# Patient Record
Sex: Female | Born: 1950 | Race: Black or African American | Hispanic: No | Marital: Single | Smoking: Never smoker
Health system: Southern US, Community
[De-identification: ages and names within clinical notes are randomized; demographics above are authoritative.]

## PROBLEM LIST (undated history)

## (undated) DIAGNOSIS — E119 Type 2 diabetes mellitus without complications: Secondary | ICD-10-CM

## (undated) DIAGNOSIS — I872 Venous insufficiency (chronic) (peripheral): Secondary | ICD-10-CM

## (undated) DIAGNOSIS — I10 Essential (primary) hypertension: Secondary | ICD-10-CM

## (undated) DIAGNOSIS — F32A Depression, unspecified: Secondary | ICD-10-CM

## (undated) DIAGNOSIS — F4310 Post-traumatic stress disorder, unspecified: Secondary | ICD-10-CM

## (undated) DIAGNOSIS — E8881 Metabolic syndrome: Secondary | ICD-10-CM

## (undated) DIAGNOSIS — E039 Hypothyroidism, unspecified: Secondary | ICD-10-CM

## (undated) DIAGNOSIS — I480 Paroxysmal atrial fibrillation: Secondary | ICD-10-CM

## (undated) DIAGNOSIS — I69911 Memory deficit following unspecified cerebrovascular disease: Secondary | ICD-10-CM

## (undated) DIAGNOSIS — E0500 Thyrotoxicosis with diffuse goiter without thyrotoxic crisis or storm: Secondary | ICD-10-CM

## (undated) DIAGNOSIS — A048 Other specified bacterial intestinal infections: Secondary | ICD-10-CM

## (undated) DIAGNOSIS — E785 Hyperlipidemia, unspecified: Secondary | ICD-10-CM

## (undated) DIAGNOSIS — E079 Disorder of thyroid, unspecified: Secondary | ICD-10-CM

## (undated) HISTORY — DX: Metabolic syndrome: E88.81

## (undated) HISTORY — DX: Other specified bacterial intestinal infections: A04.8

## (undated) HISTORY — DX: Metabolic syndrome: E88.810

## (undated) HISTORY — DX: Depression, unspecified: F32.A

## (undated) HISTORY — DX: Paroxysmal atrial fibrillation: I48.0

## (undated) HISTORY — PX: ESOPHAGOGASTRODUODENOSCOPY: SHX1529

## (undated) HISTORY — DX: Hypothyroidism, unspecified: E03.9

## (undated) HISTORY — DX: Venous insufficiency (chronic) (peripheral): I87.2

## (undated) HISTORY — DX: Post-traumatic stress disorder, unspecified: F43.10

## (undated) HISTORY — PX: THYROIDECTOMY: SHX17

## (undated) HISTORY — PX: COLONOSCOPY: SHX174

## (undated) HISTORY — PX: VAGINAL HYSTERECTOMY: SUR661

## (undated) HISTORY — DX: Memory deficit following unspecified cerebrovascular disease: I69.911

## (undated) HISTORY — DX: Hyperlipidemia, unspecified: E78.5

---

## 2014-10-23 ENCOUNTER — Emergency Department (HOSPITAL_COMMUNITY)
Admission: EM | Admit: 2014-10-23 | Discharge: 2014-10-23 | Disposition: A | Discharge status: Home / Self Care | Payer: Non-veteran care | Attending: Emergency Medicine | Admitting: Emergency Medicine

## 2014-10-23 ENCOUNTER — Encounter (HOSPITAL_COMMUNITY): Payer: Self-pay | Admitting: *Deleted

## 2014-10-23 ENCOUNTER — Emergency Department (HOSPITAL_COMMUNITY): Payer: Non-veteran care

## 2014-10-23 DIAGNOSIS — I498 Other specified cardiac arrhythmias: Secondary | ICD-10-CM

## 2014-10-23 DIAGNOSIS — R110 Nausea: Secondary | ICD-10-CM | POA: Insufficient documentation

## 2014-10-23 DIAGNOSIS — R42 Dizziness and giddiness: Secondary | ICD-10-CM | POA: Insufficient documentation

## 2014-10-23 DIAGNOSIS — R002 Palpitations: Secondary | ICD-10-CM

## 2014-10-23 DIAGNOSIS — R008 Other abnormalities of heart beat: Secondary | ICD-10-CM | POA: Insufficient documentation

## 2014-10-23 DIAGNOSIS — I499 Cardiac arrhythmia, unspecified: Secondary | ICD-10-CM

## 2014-10-23 DIAGNOSIS — E119 Type 2 diabetes mellitus without complications: Secondary | ICD-10-CM | POA: Insufficient documentation

## 2014-10-23 DIAGNOSIS — M79606 Pain in leg, unspecified: Secondary | ICD-10-CM | POA: Insufficient documentation

## 2014-10-23 DIAGNOSIS — I10 Essential (primary) hypertension: Secondary | ICD-10-CM | POA: Insufficient documentation

## 2014-10-23 HISTORY — DX: Essential (primary) hypertension: I10

## 2014-10-23 HISTORY — DX: Type 2 diabetes mellitus without complications: E11.9

## 2014-10-23 LAB — CBC WITH DIFFERENTIAL/PLATELET
Basophils Absolute: 0 10*3/uL (ref 0.0–0.1)
Basophils Relative: 0 % (ref 0–1)
Eosinophils Absolute: 0 10*3/uL (ref 0.0–0.7)
Eosinophils Relative: 0 % (ref 0–5)
HEMATOCRIT: 35.4 % — AB (ref 36.0–46.0)
HEMOGLOBIN: 11.7 g/dL — AB (ref 12.0–15.0)
LYMPHS ABS: 1.1 10*3/uL (ref 0.7–4.0)
LYMPHS PCT: 28 % (ref 12–46)
MCH: 27 pg (ref 26.0–34.0)
MCHC: 33.1 g/dL (ref 30.0–36.0)
MCV: 81.8 fL (ref 78.0–100.0)
Monocytes Absolute: 0.5 10*3/uL (ref 0.1–1.0)
Monocytes Relative: 12 % (ref 3–12)
NEUTROS ABS: 2.3 10*3/uL (ref 1.7–7.7)
Neutrophils Relative %: 60 % (ref 43–77)
Platelets: 199 10*3/uL (ref 150–400)
RBC: 4.33 MIL/uL (ref 3.87–5.11)
RDW: 14 % (ref 11.5–15.5)
WBC: 3.9 10*3/uL — AB (ref 4.0–10.5)

## 2014-10-23 LAB — I-STAT CHEM 8, ED
BUN: 18 mg/dL (ref 6–23)
CALCIUM ION: 1.22 mmol/L (ref 1.13–1.30)
Chloride: 102 mmol/L (ref 96–112)
Creatinine, Ser: 0.8 mg/dL (ref 0.50–1.10)
GLUCOSE: 176 mg/dL — AB (ref 70–99)
HEMATOCRIT: 37 % (ref 36.0–46.0)
Hemoglobin: 12.6 g/dL (ref 12.0–15.0)
Potassium: 4 mmol/L (ref 3.5–5.1)
SODIUM: 140 mmol/L (ref 135–145)
TCO2: 21 mmol/L (ref 0–100)

## 2014-10-23 LAB — CBG MONITORING, ED: GLUCOSE-CAPILLARY: 168 mg/dL — AB (ref 70–99)

## 2014-10-23 LAB — TROPONIN I: Troponin I: <0.03 ng/mL (ref <0.031)

## 2014-10-23 NOTE — ED Notes (Signed)
Pt undressed, in gown, on monitor, continuous pulse oximetry and blood pressure cuff; vitals and EKG performed 

## 2014-10-23 NOTE — ED Provider Notes (Signed)
CSN: [id number]     Arrival date & time 10/23/14  0610 History   None    Chief Complaint  Patient presents with  . Palpitations     (Consider location/radiation/quality/duration/timing/severity/associated sxs/prior Treatment) HPI Comments: Pt states that she was seen by cardiology several years ago for similar episodes and nothing was started  Patient is a 63 y.o. female presenting with palpitations. The history is provided by the patient. No language interpreter was used.  Palpitations Palpitations quality:  Fast Onset quality:  Sudden Timing:  Constant Progression:  Unchanged Chronicity:  Recurrent Relieved by:  Nothing Worsened by:  Nothing Ineffective treatments:  None tried Associated symptoms: dizziness, leg pain and nausea   Associated symptoms: no back pain, no cough, no diaphoresis, no lower extremity edema, no shortness of breath and no vomiting   Risk factors: diabetes mellitus     Past Medical History  Diagnosis Date  . Hypertension   . Diabetes mellitus without complication     Type 2   Past Surgical History  Procedure Laterality Date  . Vaginal hysterectomy     No family history on file. History  Substance Use Topics  . Smoking status: Never Smoker   . Smokeless tobacco: Never Used  . Alcohol Use: No   OB History    No data available     Review of Systems  Constitutional: Negative for diaphoresis.  Respiratory: Negative for cough and shortness of breath.   Cardiovascular: Positive for palpitations.  Gastrointestinal: Positive for nausea. Negative for vomiting.  Musculoskeletal: Negative for back pain.  Neurological: Positive for dizziness.  All other systems reviewed and are negative.     Allergies  Review of patient's allergies indicates not on file.  Home Medications   Prior to Admission medications   Not on File   BP 185/76 mmHg  Pulse 93  Temp(Src) 98.4 F (36.9 C) (Oral)  Resp 18  Ht  (1.676 m)  Wt 162 lb (73.483 kg)   BMI 26.16 kg/m2  SpO2 100% Physical Exam  Constitutional: She is oriented to person, place, and time. She appears well-developed and well-nourished.  Cardiovascular: Normal rate and regular rhythm.   Pulmonary/Chest: Effort normal and breath sounds normal.  Abdominal: Soft. Bowel sounds are normal. She exhibits no distension.  Musculoskeletal: Normal range of motion.  Neurological: She is alert and oriented to person, place, and time.  Skin: Skin is warm and dry.  Psychiatric: She has a normal mood and affect.  Nursing note and vitals reviewed.   ED Course  Procedures (including critical care time) Labs Review Labs Reviewed  CBC WITH DIFFERENTIAL/PLATELET - Abnormal; Notable for the following:    WBC 3.9 (*)    Hemoglobin 11.7 (*)    HCT 35.4 (*)    All other components within normal limits  I-STAT CHEM 8, ED - Abnormal; Notable for the following:    Glucose, Bld 176 (*)    All other components within normal limits  CBG MONITORING, ED - Abnormal; Notable for the following:    Glucose-Capillary 168 (*)    All other components within normal limits  TROPONIN I    Imaging Review Dg Chest 2 View  10/23/2014   CLINICAL DATA:  Chest heaviness and weakness for 1 day. Initial encounter.  EXAM: CHEST  2 VIEW  COMPARISON:  None.  FINDINGS: Cardiopericardial silhouette within normal limits. Mediastinal contours normal. Trachea midline. No airspace disease or effusion. Increased density is present over the mid thoracic spine which is  most compatible with a marginal osteophyte.  IMPRESSION: No active cardiopulmonary disease.   Electronically Signed   By: Andreas NewportGeoffrey  Lamke M.D.   On: 10/23/2014 07:05     EKG Interpretation   Date/Time:  Friday October 23 2014 06:19:01 EST Ventricular Rate:  95 PR Interval:  153 QRS Duration: 78 QT Interval:  407 QTC Calculation: 512 R Axis:   38 Text Interpretation:  Ventricular bigeminy Probable left atrial  enlargement Prolonged QT interval No previous  ECGs available Reconfirmed  by DOCHERTY  MD, MEGAN ([id number]) on 10/23/2014 7:13:24 AM      MDM   Final diagnoses:  Palpitations  Bigeminal rhythm    Discussed findings with pt and pt told that she may need medication changes and she could discuss that with pcp or with cardiology. Pt doesn't have cardiology set up here. Pt states that she goes to doctor in [place]    Ta Fair, NP 10/23/14 [id number]  Richardean Canalavid H Yao, MD 10/23/14 2255

## 2014-10-23 NOTE — ED Notes (Signed)
Pt c/o palpitations and could feel her heart beating in her neck. Pt also c/o lower bilateral le pain that began yesterday. Pt also states concern of her blood sugar.

## 2014-10-23 NOTE — Discharge Instructions (Signed)
Follow up with your doctor or cardiology to discuss medication changes Premature Ventricular Contraction Premature ventricular contraction (PVC) is an irregularity of the heart rhythm involving extra or skipped heartbeats. In some cases, they may occur without obvious cause or heart disease. Other times, they can be caused by an electrolyte change in the blood. These need to be corrected. They can also be seen when there is not enough oxygen going to the heart. A common cause of this is plaque or cholesterol buildup. This buildup decreases the blood supply to the heart. In addition, extra beats may be caused or aggravated by:  Excessive smoking.  Alcohol consumption.  Caffeine.  Certain medications  Some street drugs. SYMPTOMS   The sensation of feeling your heart skipping a beat (palpitations).  In many cases, the person may have no symptoms. SIGNS AND TESTS   A physical examination may show an occasional irregularity, but if the PVC beats do not happen often, they may not be found on physical exam.  Blood pressure is usually normal.  Other tests that may find extra beats of the heart are:  An EKG (electrocardiogram)  A Holter monitor which can monitor your heart over longer periods of time  An Angiogram (study of the heart arteries). TREATMENT  Usually extra heartbeats do not need treatment. The condition is treated only if symptoms are severe or if extra beats are very frequent or are causing problems. An underlying cause, if discovered, may also require treatment.  Treatment may also be needed if there may be a risk for other more serious cardiac arrhythmias.  PREVENTION   Moderation in caffeine, alcohol, and tobacco use may reduce the risk of ectopic heartbeats in some people.  Exercise often helps people who lead a sedentary (inactive) lifestyle. PROGNOSIS  PVC heartbeats are generally harmless and do not need treatment.  RISKS AND COMPLICATIONS   Ventricular  tachycardia (occasionally).  There usually are no complications.  Other arrhythmias (occasionally). SEEK IMMEDIATE MEDICAL CARE IF:   You feel palpitations that are frequent or continual.  You develop chest pain or other problems such as shortness of breath, sweating, or nausea and vomiting.  You become light-headed or faint (pass out).  You get worse or do not improve with treatment. Document Released: 03/17/2004 Document Revised: 10/23/2011 Document Reviewed: 09/27/2007 [facility] Patient Information 2015 [employer] [place]. This information is not intended to replace advice given to you by your health care provider. Make sure you discuss any questions you have with your health care provider.

## 2016-06-08 ENCOUNTER — Encounter (HOSPITAL_COMMUNITY): Payer: Self-pay | Admitting: Emergency Medicine

## 2016-06-08 ENCOUNTER — Emergency Department (HOSPITAL_COMMUNITY): Payer: Non-veteran care

## 2016-06-08 ENCOUNTER — Emergency Department (HOSPITAL_COMMUNITY)
Admission: EM | Admit: 2016-06-08 | Discharge: 2016-06-08 | Disposition: A | Discharge status: Home / Self Care | Payer: Non-veteran care | Attending: Emergency Medicine | Admitting: Emergency Medicine

## 2016-06-08 DIAGNOSIS — Z79899 Other long term (current) drug therapy: Secondary | ICD-10-CM | POA: Insufficient documentation

## 2016-06-08 DIAGNOSIS — Y929 Unspecified place or not applicable: Secondary | ICD-10-CM | POA: Diagnosis not present

## 2016-06-08 DIAGNOSIS — Z7982 Long term (current) use of aspirin: Secondary | ICD-10-CM | POA: Diagnosis not present

## 2016-06-08 DIAGNOSIS — W109XXA Fall (on) (from) unspecified stairs and steps, initial encounter: Secondary | ICD-10-CM | POA: Diagnosis not present

## 2016-06-08 DIAGNOSIS — Y9301 Activity, walking, marching and hiking: Secondary | ICD-10-CM | POA: Insufficient documentation

## 2016-06-08 DIAGNOSIS — R6883 Chills (without fever): Secondary | ICD-10-CM | POA: Diagnosis not present

## 2016-06-08 DIAGNOSIS — Z7984 Long term (current) use of oral hypoglycemic drugs: Secondary | ICD-10-CM | POA: Diagnosis not present

## 2016-06-08 DIAGNOSIS — Y999 Unspecified external cause status: Secondary | ICD-10-CM | POA: Diagnosis not present

## 2016-06-08 DIAGNOSIS — E119 Type 2 diabetes mellitus without complications: Secondary | ICD-10-CM | POA: Diagnosis not present

## 2016-06-08 DIAGNOSIS — R5383 Other fatigue: Secondary | ICD-10-CM | POA: Insufficient documentation

## 2016-06-08 DIAGNOSIS — I10 Essential (primary) hypertension: Secondary | ICD-10-CM | POA: Diagnosis not present

## 2016-06-08 DIAGNOSIS — R531 Weakness: Secondary | ICD-10-CM | POA: Insufficient documentation

## 2016-06-08 DIAGNOSIS — J189 Pneumonia, unspecified organism: Secondary | ICD-10-CM | POA: Insufficient documentation

## 2016-06-08 DIAGNOSIS — J181 Lobar pneumonia, unspecified organism: Secondary | ICD-10-CM

## 2016-06-08 DIAGNOSIS — R51 Headache: Secondary | ICD-10-CM | POA: Insufficient documentation

## 2016-06-08 DIAGNOSIS — R002 Palpitations: Secondary | ICD-10-CM | POA: Diagnosis present

## 2016-06-08 HISTORY — DX: Thyrotoxicosis with diffuse goiter without thyrotoxic crisis or storm: E05.00

## 2016-06-08 HISTORY — DX: Disorder of thyroid, unspecified: E07.9

## 2016-06-08 LAB — CBC WITH DIFFERENTIAL/PLATELET
Basophils Absolute: 0 10*3/uL (ref 0.0–0.1)
Basophils Relative: 0 %
Eosinophils Absolute: 0 10*3/uL (ref 0.0–0.7)
Eosinophils Relative: 0 %
HEMATOCRIT: 43.3 % (ref 36.0–46.0)
HEMOGLOBIN: 14.1 g/dL (ref 12.0–15.0)
LYMPHS PCT: 16 %
Lymphs Abs: 1.3 10*3/uL (ref 0.7–4.0)
MCH: 26.9 pg (ref 26.0–34.0)
MCHC: 32.6 g/dL (ref 30.0–36.0)
MCV: 82.5 fL (ref 78.0–100.0)
Monocytes Absolute: 0.3 10*3/uL (ref 0.1–1.0)
Monocytes Relative: 4 %
Neutro Abs: 6.5 10*3/uL (ref 1.7–7.7)
Neutrophils Relative %: 80 %
Platelets: 292 10*3/uL (ref 150–400)
RBC: 5.25 MIL/uL — ABNORMAL HIGH (ref 3.87–5.11)
RDW: 15.2 % (ref 11.5–15.5)
WBC: 8.2 10*3/uL (ref 4.0–10.5)

## 2016-06-08 LAB — URINALYSIS, ROUTINE W REFLEX MICROSCOPIC
Bilirubin Urine: NEGATIVE
GLUCOSE, UA: NEGATIVE mg/dL
Hgb urine dipstick: NEGATIVE
Ketones, ur: NEGATIVE mg/dL
Nitrite: NEGATIVE
PH: 6.5 (ref 5.0–8.0)
Protein, ur: NEGATIVE mg/dL
Specific Gravity, Urine: 1.003 — ABNORMAL LOW (ref 1.005–1.030)

## 2016-06-08 LAB — COMPREHENSIVE METABOLIC PANEL
ALT: 22 U/L (ref 14–54)
AST: 27 U/L (ref 15–41)
Albumin: 4.3 g/dL (ref 3.5–5.0)
Alkaline Phosphatase: 85 U/L (ref 38–126)
Anion gap: 8 (ref 5–15)
BUN: 11 mg/dL (ref 6–20)
CO2: 26 mmol/L (ref 22–32)
CREATININE: 0.97 mg/dL (ref 0.44–1.00)
Calcium: 9.7 mg/dL (ref 8.9–10.3)
Chloride: 103 mmol/L (ref 101–111)
GFR calc Af Amer: >60 mL/min (ref >60)
Glucose, Bld: 182 mg/dL — ABNORMAL HIGH (ref 65–99)
Potassium: 4.2 mmol/L (ref 3.5–5.1)
Sodium: 137 mmol/L (ref 135–145)
Total Bilirubin: 0.7 mg/dL (ref 0.3–1.2)
Total Protein: 8.1 g/dL (ref 6.5–8.1)

## 2016-06-08 LAB — URINE MICROSCOPIC-ADD ON
Bacteria, UA: NONE SEEN
RBC / HPF: NONE SEEN RBC/hpf (ref 0–5)

## 2016-06-08 LAB — I-STAT TROPONIN, ED: TROPONIN I, POC: 0 ng/mL (ref 0.00–0.08)

## 2016-06-08 LAB — T4, FREE: Free T4: 0.92 ng/dL (ref 0.61–1.12)

## 2016-06-08 LAB — TSH: TSH: 9.561 u[IU]/mL — ABNORMAL HIGH (ref 0.350–4.500)

## 2016-06-08 MED ORDER — LEVOFLOXACIN 500 MG PO TABS: 500.0000 mg | ORAL_TABLET | Freq: Every day | ORAL | 0 refills | Status: AC

## 2016-06-08 MED ORDER — ONDANSETRON 4 MG PO TBDP: 4.0000 mg | ORAL_TABLET | Freq: Three times a day (TID) | ORAL | 0 refills | Status: DC | PRN

## 2016-06-08 MED ORDER — SODIUM CHLORIDE 0.9 % IV BOLUS (SEPSIS)
1000.0000 mL | Freq: Once | INTRAVENOUS | Status: AC
  Administered 2016-06-08: 1000 mL via INTRAVENOUS

## 2016-06-08 MED ORDER — LISINOPRIL 20 MG PO TABS
20.0000 mg | ORAL_TABLET | Freq: Once | ORAL | Status: AC
  Administered 2016-06-08: 20 mg via ORAL
  Filled 2016-06-08: qty 1

## 2016-06-08 MED ORDER — LISINOPRIL 10 MG PO TABS: 30.0000 mg | ORAL_TABLET | Freq: Every day | ORAL | 0 refills | Status: DC

## 2016-06-08 MED ORDER — HYDRALAZINE HCL 25 MG PO TABS
50.0000 mg | ORAL_TABLET | Freq: Once | ORAL | Status: AC
  Administered 2016-06-08: 50 mg via ORAL
  Filled 2016-06-08: qty 2

## 2016-06-08 MED ORDER — IOPAMIDOL (ISOVUE-300) INJECTION 61%
INTRAVENOUS | Status: AC
  Administered 2016-06-08: 100 mL
  Filled 2016-06-08: qty 100

## 2016-06-08 NOTE — ED Notes (Signed)
Patient has returned from being out of the department; placed back on monitor, continuous pulse oximetry and blood pressure cuff 

## 2016-06-08 NOTE — ED Notes (Signed)
Pt called out c/o palpations and back pain. Pt BP 199/80. Pt states "I feel bad." EKG performed. Dr. Dalene SeltzerSchlossman notified and given EKG.

## 2016-06-08 NOTE — ED Triage Notes (Signed)
Pt. woke up with palpitations , fatigue/generalized weakness , lost her balance and fell at home this morning , denies LOC , alert and oriented , respirations unlabored. No fever or chills.

## 2016-06-08 NOTE — ED Provider Notes (Signed)
[facility]-EMERGENCY DEPT Provider Note   CSN: [id number] Arrival date & time: 06/08/16  0603     History   Chief Complaint Chief Complaint  Patient presents with  . Palpitations  . Fall  . Fatigue    HPI Stacy Nash is a 65 y.o. female.  HPI   Lost balance when walking down to bottom step. Had to stay there.   Fell down 2 steps, no head trauma, no LOC, went to right side.  Able to walk ok after falling.  No energy. Has fallen before, in the past passed out, [facility]   Feeling cold, like can't get warm. Had total thyroidectomy last year, had Grave's disease, DM, htn  Feeling badly for day or two, fatigue generally weak, no fevers. Dry mouth, chills.  Nausea, no vomiting.    Checked BP at home and it read 212/118, read irregular heart beat.   Past Medical History:  Diagnosis Date  . Diabetes mellitus without complication (HCC)    Type 2  . Graves disease   . Hypertension   . Thyroid disease     There are no active problems to display for this patient.   Past Surgical History:  Procedure Laterality Date  . VAGINAL HYSTERECTOMY      OB History    No data available       Home Medications    Prior to Admission medications   Medication Sig Start Date End Date Taking? Authorizing Provider  AMLODIPINE BESYLATE PO Take 1 tablet by mouth daily.   Yes Historical Provider, MD  aspirin EC 81 MG tablet Take 81 mg by mouth daily.   Yes Historical Provider, MD  CARVEDILOL PO Take 1 tablet by mouth 2 (two) times daily.   Yes Historical Provider, MD  GABAPENTIN PO Take 1 capsule by mouth 3 (three) times daily.   Yes Historical Provider, MD  levothyroxine (SYNTHROID, LEVOTHROID) 88 MCG tablet Take 88 mcg by mouth daily before breakfast.   Yes Historical Provider, MD  MAGNESIUM PO Take 1 tablet by mouth daily.   Yes Historical Provider, MD  METFORMIN HCL PO Take 1 tablet by mouth daily.   Yes Historical Provider, MD  potassium chloride SA  (K-DUR,KLOR-CON) 20 MEQ tablet Take 20 mEq by mouth 2 (two) times daily.   Yes Historical Provider, MD  PRAVASTATIN SODIUM PO Take 1 tablet by mouth daily.   Yes Historical Provider, MD  TRAZODONE HCL PO Take 1 tablet by mouth at bedtime.   Yes Historical Provider, MD  levofloxacin (LEVAQUIN) 500 MG tablet Take 1 tablet (500 mg total) by mouth daily. 06/08/16 06/15/16  Alvira MondayErin Lynard Postlewait, MD  lisinopril (PRINIVIL,ZESTRIL) 10 MG tablet Take 3 tablets (30 mg total) by mouth daily. 06/08/16 07/08/16  Alvira MondayErin Jaquavian Firkus, MD  ondansetron (ZOFRAN ODT) 4 MG disintegrating tablet Take 1 tablet (4 mg total) by mouth every 8 (eight) hours as needed for nausea or vomiting. 06/08/16   Alvira MondayErin Yalanda Soderman, MD    Family History No family history on file.  Social History Social History  Substance Use Topics  . Smoking status: Never Smoker  . Smokeless tobacco: Never Used  . Alcohol use No     Allergies   Sudafed [pseudoephedrine hcl]   Review of Systems Review of Systems  Constitutional: Positive for fatigue. Negative for fever.  HENT: Negative for congestion and sore throat.   Eyes: Negative for visual disturbance.  Respiratory: Negative for cough and shortness of breath.   Cardiovascular: Negative  for chest pain.  Gastrointestinal: Positive for diarrhea (x2) and nausea. Negative for abdominal pain, blood in stool and vomiting.  Genitourinary: Negative for difficulty urinating and dysuria.  Musculoskeletal: Positive for myalgias (since fall legs hurt). Negative for back pain and neck pain.  Skin: Negative for rash.  Neurological: Negative for syncope, facial asymmetry, speech difficulty, weakness, numbness and headaches.     Physical Exam Updated Vital Signs BP 165/89   Pulse 76   Temp 98.1 F (36.7 C)   Resp 25   Ht 5\' 5"  (1.651 m)   Wt 170 lb (77.1 kg)   SpO2 98%   BMI 28.29 kg/m   Physical Exam  Constitutional: She is oriented to person, place, and time. She appears well-developed and  well-nourished. No distress.  Appears fatigued, tearful at times  HENT:  Head: Normocephalic and atraumatic.  Mouth/Throat: Oropharynx is clear and moist. No oropharyngeal exudate.  Eyes: Conjunctivae and EOM are normal. Pupils are equal, round, and reactive to light.  Neck: Normal range of motion.  Cardiovascular: Normal rate, regular rhythm, normal heart sounds and intact distal pulses.  Exam reveals no gallop and no friction rub.   No murmur heard. Pulmonary/Chest: Effort normal and breath sounds normal. No respiratory distress. She has no wheezes. She has no rales.  Abdominal: Soft. She exhibits no distension. There is tenderness (on reevaluation reports LLQ tenderness). There is CVA tenderness (L). There is no guarding.  Musculoskeletal: She exhibits no edema or tenderness.  Neurological: She is alert and oriented to person, place, and time. She has normal strength. No cranial nerve deficit or sensory deficit. Coordination and gait normal. GCS eye subscore is 4. GCS verbal subscore is 5. GCS motor subscore is 6.  Skin: Skin is warm and dry. No rash noted. She is not diaphoretic. No erythema.  Nursing note and vitals reviewed.    ED Treatments / Results  Labs (all labs ordered are listed, but only abnormal results are displayed) Labs Reviewed  CBC WITH DIFFERENTIAL/PLATELET - Abnormal; Notable for the following:       Result Value   RBC 5.25 (*)    All other components within normal limits  COMPREHENSIVE METABOLIC PANEL - Abnormal; Notable for the following:    Glucose, Bld 182 (*)    All other components within normal limits  URINALYSIS, ROUTINE W REFLEX MICROSCOPIC (NOT AT [facility]) - Abnormal; Notable for the following:    Specific Gravity, Urine 1.003 (*)    Leukocytes, UA MODERATE (*)    All other components within normal limits  TSH - Abnormal; Notable for the following:    TSH 9.561 (*)    All other components within normal limits  URINE MICROSCOPIC-ADD ON - Abnormal;  Notable for the following:    Squamous Epithelial / LPF 0-5 (*)    All other components within normal limits  T4, FREE  I-STAT TROPOININ, ED    EKG  EKG Interpretation  Date/Time:  Thursday June 08 2016 06:11:54 EDT Ventricular Rate:  70 PR Interval:  170 QRS Duration: 78 QT Interval:  408 QTC Calculation: 440 R Axis:   71 Text Interpretation:  Normal sinus rhythm Normal ECG No significant change since last tracing Confirmed by [facility] MD, Helga Asbury ([id number]) on 06/08/2016 7:02:43 AM Also confirmed by [facility] MD, Kennadie Brenner ([id number]), editor [place] CT, Jola Babinski [phone])  on 06/08/2016 8:42:10 AM       Radiology Dg Chest 2 View  Result Date: 06/08/2016 CLINICAL DATA:  65 year old female with fall and palpitation.  EXAM: CHEST  2 VIEW COMPARISON:  Chest radiograph dated 10/23/2014 FINDINGS: The lungs are clear. There is no pleural effusion or pneumothorax. The cardiac silhouette is within normal limits. There is a focal area of minimal cortical irregularity and apparent angulation involving the right humeral neck. Although this may be artifactual a nondisplaced fracture is not excluded. Correlation with clinical exam and symptoms and further evaluation with dedicated right shoulder radiographs, if clinically indicated, recommended. IMPRESSION: No acute cardiopulmonary process. Artifact versus less likely nondisplaced fracture of the right humeral neck. Clinical correlation is recommended. Electronically Signed   By: Elgie Collard M.D.   On: 06/08/2016 06:49   Ct Head Wo Contrast  Result Date: 06/08/2016 CLINICAL DATA:  Loss of balance, fall, acute headache, hypertension and diabetes EXAM: CT HEAD WITHOUT CONTRAST TECHNIQUE: Contiguous axial images were obtained from the base of the skull through the vertex without intravenous contrast. COMPARISON:  None available FINDINGS: Brain: No evidence of acute infarction, hemorrhage, hydrocephalus, extra-axial collection or mass lesion/mass effect.  Vascular: No hyperdense vessel or unexpected calcification. Skull: Normal. Negative for fracture or focal lesion. Sinuses/Orbits: No acute finding. Other: None. IMPRESSION: Normal head CT for age.  No acute intracranial abnormality. Electronically Signed   By: Judie Petit.  Shick M.D.   On: 06/08/2016 12:04   Ct Abdomen Pelvis W Contrast  Result Date: 06/08/2016 CLINICAL DATA:  65 year old female with a history of Graves disease presents with fatigue, generalized weakness and fall this morning. EXAM: CT ABDOMEN AND PELVIS WITH CONTRAST TECHNIQUE: Multidetector CT imaging of the abdomen and pelvis was performed using the standard protocol following bolus administration of intravenous contrast. CONTRAST:  100 cc Isovue-300 IV. COMPARISON:  None. FINDINGS: Lower chest: Patchy 2.1 x 1.8 cm focus of consolidation in the right middle lobe (series 3/ image 7). Hepatobiliary: Normal liver with no liver mass. Normal gallbladder with no radiopaque cholelithiasis. No biliary ductal dilatation. Pancreas: Normal, with no mass or duct dilation. Spleen: Normal size. No mass. Adrenals/Urinary Tract: Normal adrenals. Simple 2.7 cm renal cyst in the lateral lower left kidney. No hydronephrosis. Normal bladder. Stomach/Bowel: Grossly normal stomach. Normal caliber small bowel with no small bowel wall thickening. Normal appendix. Normal large bowel with no diverticulosis, large bowel wall thickening or pericolonic fat stranding. Vascular/Lymphatic: Atherosclerotic nonaneurysmal abdominal aorta. Patent portal, splenic, hepatic and renal veins. No pathologically enlarged lymph nodes in the abdomen or pelvis. Reproductive: Status post hysterectomy, with no abnormal findings at the vaginal cuff. Simple 1.1 cm right adnexal cyst (series 2/ image 63). No suspicious adnexal masses. Other: No pneumoperitoneum, ascites or focal fluid collection. Musculoskeletal: No aggressive appearing focal osseous lesions. Mild thoracolumbar spondylosis.  IMPRESSION: 1. Small patchy focus of consolidation in the right middle lobe, favor infectious or inflammatory focus. Recommend a follow-up post treatment noncontrast chest CT in 3 months. 2. No acute abnormality in the abdomen or pelvis. 3. Aortic atherosclerosis. 4. Simple 1.1 cm right adnexal cyst, for which no further follow-up is required. This recommendation follows ACR consensus guidelines: White Paper of the ACR Incidental Findings Committee II on Adnexal Findings. J Am Coll Radiol [phone]. Electronically Signed   By: Delbert Phenix M.D.   On: 06/08/2016 14:39    Procedures Procedures (including critical care time)  Medications Ordered in ED Medications  hydrALAZINE (APRESOLINE) tablet 50 mg (50 mg Oral Given 06/08/16 0803)  lisinopril (PRINIVIL,ZESTRIL) tablet 20 mg (20 mg Oral Given 06/08/16 0803)  sodium chloride 0.9 % bolus 1,000 mL (0 mLs Intravenous Stopped 06/08/16 1520)  iopamidol (ISOVUE-300) 61 % injection (100 mLs  Contrast Given 06/08/16 1400)     Initial Impression / Assessment and Plan / ED Course  I have reviewed the triage vital signs and the nursing notes.  Pertinent labs & imaging results that were available during my care of the patient were reviewed by me and considered in my medical decision making (see chart for details).  Clinical Course   65 year old female with a history diabetes, Graves' disease, hypertension, thyroidectomy presents with concern for generalized weakness, chills and and fall.  Regarding fall, patient denies areas of tenderness, including no leg pain, arm pain, neck pain and initially no back pain. She did report a headache after she had been in the emergency department, and head CT was performed which showed no acute abnormalities. Headache is not consistent with a subarachnoid hemorrhage. Her neurologic exam is normal, she has no ataxia, and have low suspicion for acute stroke. Patient describes chills, feeling of hot and cold, however do  not detect infectious etiology of symptoms. Urinalysis is negative, chest x-ray shows no pneumonia.  Troponin negative. No chest pain to suggest pulmonary embolus or ACS.  TSH was elevated, however free T4 is within normal limits. Pt with frequent PVCs as etiology of palpitations and hx of the same.  Patient with some blood pressures elevated in the 200s, however after given her home medications, blood pressures have improved to the 170s systolic, and she does not have any symptoms of blood pressure emergency at this time. While in the emergency department, patient continues to describe a feeling of generalized weakness, as well as helplessness regarding these intermittent symptoms since she had her thyroid taken out. Given her abdominal pain, feeling of chills, and diarrhea, a CT abdomen and pelvis was ordered to evaluate for diverticulitis.    CT abd/pelvis shows no acute intraabdominal process, however is concerning for RML pneumonia.  She is generally weak, however is able to ambulate to restroom independently, hemodynamically stable, with normal oxygenation on room air, with PSI/Port 54 points and feel outpatient management is most appropriate.  She was hospitalized at [facility] at the beginning of August. Will treat with levofloxacin, provide strict return precautions. Given BP elevated, will increase dose of lisinopril from 20mg  to 30mg . Recommend repeat CT in 3mos for resolution of right middle lobe opacities.  Patient discharged in stable condition with understanding of reasons to return.         Final Clinical Impressions(s) / ED Diagnoses   Final diagnoses:  Pneumonia of right middle lobe due to infectious organism [facility])  Fatigue, unspecified type  Generalized weakness  Chills    New Prescriptions Discharge Medication List as of 06/08/2016  3:09 PM    START taking these medications   Details  levofloxacin (LEVAQUIN) 500 MG tablet Take 1 tablet (500 mg total) by mouth daily., Starting  Thu 06/08/2016, Until Thu 06/15/2016, Print    ondansetron (ZOFRAN ODT) 4 MG disintegrating tablet Take 1 tablet (4 mg total) by mouth every 8 (eight) hours as needed for nausea or vomiting., Starting Thu 06/08/2016, Print         Alvira Monday, MD 06/08/16 [id number]

## 2016-06-08 NOTE — ED Notes (Signed)
Walked patient to the bathroom  

## 2016-06-08 NOTE — ED Notes (Signed)
Patient needed to use bathroom but did not want to walk to the bathroom, asked for a bedpan; placed patient on bedpan; patient used bedpan and cleaned herself when she had finished urinating; patient also had urinated on stretcher linens; changed stretcher linens while patient sat in chair at bedside; patient now laying on stretcher, relaxing with her eyes closed; no needs at this time

## 2017-07-21 ENCOUNTER — Encounter (HOSPITAL_COMMUNITY): Payer: Self-pay | Admitting: Emergency Medicine

## 2017-07-21 ENCOUNTER — Emergency Department (HOSPITAL_COMMUNITY)
Admission: EM | Admit: 2017-07-21 | Discharge: 2017-07-21 | Disposition: A | Discharge status: Home / Self Care | Payer: Non-veteran care | Attending: Emergency Medicine | Admitting: Emergency Medicine

## 2017-07-21 DIAGNOSIS — Z760 Encounter for issue of repeat prescription: Secondary | ICD-10-CM | POA: Diagnosis not present

## 2017-07-21 DIAGNOSIS — Z7982 Long term (current) use of aspirin: Secondary | ICD-10-CM | POA: Insufficient documentation

## 2017-07-21 DIAGNOSIS — Z79899 Other long term (current) drug therapy: Secondary | ICD-10-CM | POA: Diagnosis not present

## 2017-07-21 DIAGNOSIS — I10 Essential (primary) hypertension: Secondary | ICD-10-CM | POA: Diagnosis not present

## 2017-07-21 DIAGNOSIS — E119 Type 2 diabetes mellitus without complications: Secondary | ICD-10-CM | POA: Insufficient documentation

## 2017-07-21 DIAGNOSIS — E079 Disorder of thyroid, unspecified: Secondary | ICD-10-CM | POA: Insufficient documentation

## 2017-07-21 MED ORDER — LEVOTHYROXINE SODIUM 88 MCG PO TABS: 88.0000 ug | ORAL_TABLET | Freq: Every day | ORAL | 0 refills | Status: DC

## 2017-07-21 NOTE — ED Triage Notes (Signed)
Pt here for refill of synthroid .88 mg.

## 2017-07-21 NOTE — ED Provider Notes (Signed)
[facility]ENC[facility] Provider Note   CSN: [id number] Arrival date & time: 07/21/17  0900     History   Chief Complaint Chief Complaint  Patient presents with  . Medication Refill    HPI  Steele Sizerarline Bise is a 66 y.o. female with a hx of Graves disease presents to the ED with complaint of medication refill today. Patient states that she gets her medications from the [facility] and there was a communication issue that is delaying her medication delivery. She takes 88MCG of synthroid, she took her last pill this morning. No complaints at this time. Denies chest pain, shortness of breath, headache, change in vision, numbness, weakness, abdominal pain, or N/V/D.   HPI  Past Medical History:  Diagnosis Date  . Diabetes mellitus without complication (HCC)    Type 2  . Graves disease   . Hypertension   . Thyroid disease     There are no active problems to display for this patient.   Past Surgical History:  Procedure Laterality Date  . VAGINAL HYSTERECTOMY      OB History    No data available       Home Medications    Prior to Admission medications   Medication Sig Start Date End Date Taking? Authorizing Provider  AMLODIPINE BESYLATE PO Take 1 tablet by mouth daily.    [provider]  aspirin EC 81 MG tablet Take 81 mg by mouth daily.    [provider]  CARVEDILOL PO Take 1 tablet by mouth 2 (two) times daily.    [provider]  GABAPENTIN PO Take 1 capsule by mouth 3 (three) times daily.    [provider]  levothyroxine (SYNTHROID, LEVOTHROID) 88 MCG tablet Take 88 mcg by mouth daily before breakfast.    [provider]  lisinopril (PRINIVIL,ZESTRIL) 10 MG tablet Take 3 tablets (30 mg total) by mouth daily. 06/08/16 07/08/16  Alvira MondaySchlossman, Erin, MD  MAGNESIUM PO Take 1 tablet by mouth daily.    [provider]  METFORMIN HCL PO Take 1 tablet by mouth daily.    [provider]    ondansetron (ZOFRAN ODT) 4 MG disintegrating tablet Take 1 tablet (4 mg total) by mouth every 8 (eight) hours as needed for nausea or vomiting. 06/08/16   Alvira MondaySchlossman, Erin, MD  potassium chloride SA (K-DUR,KLOR-CON) 20 MEQ tablet Take 20 mEq by mouth 2 (two) times daily.    [provider]  PRAVASTATIN SODIUM PO Take 1 tablet by mouth daily.    [provider]  TRAZODONE HCL PO Take 1 tablet by mouth at bedtime.    [provider]    Family History History reviewed. No pertinent family history.  Social History Social History   Tobacco Use  . Smoking status: Never Smoker  . Smokeless tobacco: Never Used  Substance Use Topics  . Alcohol use: No  . Drug use: Not on file     Allergies   Sudafed [pseudoephedrine hcl]   Review of Systems Review of Systems  Constitutional: Negative for chills and fever.  Eyes: Negative for visual disturbance.  Respiratory: Negative for shortness of breath.   Cardiovascular: Negative for chest pain and palpitations.  Gastrointestinal: Negative for diarrhea, nausea and vomiting.  Neurological: Negative for weakness, numbness and headaches.     Physical Exam Updated Vital Signs BP (!) 181/86 (BP Location: Right Arm)   Pulse 70   Temp 98.1 F (36.7 C) (Oral)   Resp 16  Ht 5\' 5"  (1.651 m)   Wt 81.6 kg (180 lb)   SpO2 100%   BMI 29.95 kg/m   Physical Exam  Constitutional: She appears well-developed and well-nourished. No distress.  HENT:  Head: Normocephalic and atraumatic.  Eyes: Conjunctivae are normal. Pupils are equal, round, and reactive to light.  Cardiovascular: Normal rate and regular rhythm. Exam reveals no gallop and no friction rub.  No murmur heard. Pulmonary/Chest: Effort normal and breath sounds normal. No respiratory distress. She has no wheezes.  Neurological: She is alert.  Clear speech. Sensation grossly intact x 4. Normal gait.   Psychiatric: She has a normal mood and affect. Her behavior  is normal. Thought content normal.  Nursing note and vitals reviewed.  ED Treatments / Results  Labs (all labs ordered are listed, but only abnormal results are displayed) Labs Reviewed - No data to display  EKG  EKG Interpretation None      Radiology No results found.  Procedures Procedures (including critical care time)  Medications Ordered in ED Medications - No data to display   Initial Impression / Assessment and Plan / ED Course  I have reviewed the triage vital signs and the nursing notes.  Pertinent labs & imaging results that were available during my care of the patient were reviewed by me and considered in my medical decision making (see chart for details).  Patient presents for medication refill.  There was a miscommunication between her and her pharmacy delivery service, the prescription should be here over the next couple weeks.  Patient is without complaints at this time.  We will refill her Synthroid medication for a 30 day supply.   Patient's blood pressure initially elevated in the emergency department today. Patient denies headache, change in vision, numbness, weakness, chest pain, dyspnea, dizziness, or lightheadedness therefore doubt hypertensive emergency. Discussed elevated blood pressure with the patient and the need for primary care follow up with potential need to initiate or change antihypertensive medications and or for further evaluation. Discussed return precaution signs/symptoms for hypertensive emergency as listed above with the patient. She confirmed understanding.     Final Clinical Impressions(s) / ED Diagnoses   Final diagnoses:  Medication refill    ED Discharge Orders        Ordered    levothyroxine (SYNTHROID, LEVOTHROID) 88 MCG tablet  Daily before breakfast     07/21/17 0955       Cherly Andersonetrucelli, [place] Torok R, PA-C 07/21/17 1830    Charlynne PanderYao, David Hsienta, MD 07/22/17 [phone]

## 2017-07-21 NOTE — Discharge Instructions (Signed)
You were seen in the emergency department today for a refill of your Synthroid.  I have given you a 30-day supply.

## 2017-09-23 ENCOUNTER — Other Ambulatory Visit: Payer: Self-pay

## 2017-09-23 ENCOUNTER — Emergency Department (HOSPITAL_COMMUNITY): Payer: Medicare Other

## 2017-09-23 ENCOUNTER — Encounter (HOSPITAL_COMMUNITY): Payer: Self-pay

## 2017-09-23 ENCOUNTER — Emergency Department (HOSPITAL_COMMUNITY)
Admission: EM | Admit: 2017-09-23 | Discharge: 2017-09-23 | Disposition: A | Discharge status: Home / Self Care | Payer: Medicare Other | Attending: Emergency Medicine | Admitting: Emergency Medicine

## 2017-09-23 DIAGNOSIS — Z79899 Other long term (current) drug therapy: Secondary | ICD-10-CM | POA: Insufficient documentation

## 2017-09-23 DIAGNOSIS — E119 Type 2 diabetes mellitus without complications: Secondary | ICD-10-CM | POA: Insufficient documentation

## 2017-09-23 DIAGNOSIS — R531 Weakness: Secondary | ICD-10-CM | POA: Diagnosis not present

## 2017-09-23 DIAGNOSIS — Z7982 Long term (current) use of aspirin: Secondary | ICD-10-CM | POA: Insufficient documentation

## 2017-09-23 DIAGNOSIS — E876 Hypokalemia: Secondary | ICD-10-CM | POA: Diagnosis not present

## 2017-09-23 DIAGNOSIS — R0981 Nasal congestion: Secondary | ICD-10-CM | POA: Diagnosis present

## 2017-09-23 DIAGNOSIS — Z7984 Long term (current) use of oral hypoglycemic drugs: Secondary | ICD-10-CM | POA: Insufficient documentation

## 2017-09-23 DIAGNOSIS — I10 Essential (primary) hypertension: Secondary | ICD-10-CM | POA: Diagnosis not present

## 2017-09-23 LAB — BASIC METABOLIC PANEL
ANION GAP: 14 (ref 5–15)
BUN: 14 mg/dL (ref 6–20)
CALCIUM: 8.9 mg/dL (ref 8.9–10.3)
CO2: 23 mmol/L (ref 22–32)
Chloride: 100 mmol/L — ABNORMAL LOW (ref 101–111)
Creatinine, Ser: 0.96 mg/dL (ref 0.44–1.00)
Glucose, Bld: 152 mg/dL — ABNORMAL HIGH (ref 65–99)
Potassium: 3 mmol/L — ABNORMAL LOW (ref 3.5–5.1)
SODIUM: 137 mmol/L (ref 135–145)

## 2017-09-23 LAB — CBC
HEMATOCRIT: 38.6 % (ref 36.0–46.0)
HEMOGLOBIN: 12.7 g/dL (ref 12.0–15.0)
MCH: 26.8 pg (ref 26.0–34.0)
MCHC: 32.9 g/dL (ref 30.0–36.0)
MCV: 81.4 fL (ref 78.0–100.0)
Platelets: 274 10*3/uL (ref 150–400)
RBC: 4.74 MIL/uL (ref 3.87–5.11)
RDW: 16.2 % — ABNORMAL HIGH (ref 11.5–15.5)
WBC: 6.2 10*3/uL (ref 4.0–10.5)

## 2017-09-23 LAB — T4, FREE: FREE T4: 1.24 ng/dL — AB (ref 0.61–1.12)

## 2017-09-23 LAB — URINALYSIS, ROUTINE W REFLEX MICROSCOPIC
BILIRUBIN URINE: NEGATIVE
Bacteria, UA: NONE SEEN
Glucose, UA: NEGATIVE mg/dL
KETONES UR: 5 mg/dL — AB
LEUKOCYTES UA: NEGATIVE
NITRITE: NEGATIVE
Protein, ur: NEGATIVE mg/dL
SPECIFIC GRAVITY, URINE: 1.002 — AB (ref 1.005–1.030)
Squamous Epithelial / LPF: NONE SEEN
pH: 8 (ref 5.0–8.0)

## 2017-09-23 LAB — I-STAT TROPONIN, ED: TROPONIN I, POC: 0 ng/mL (ref 0.00–0.08)

## 2017-09-23 LAB — TSH: TSH: 2.06 u[IU]/mL (ref 0.350–4.500)

## 2017-09-23 LAB — CBG MONITORING, ED: Glucose-Capillary: 142 mg/dL — ABNORMAL HIGH (ref 65–99)

## 2017-09-23 MED ORDER — POTASSIUM CHLORIDE 10 MEQ/100ML IV SOLN
10.0000 meq | Freq: Once | INTRAVENOUS | Status: AC
  Administered 2017-09-23: 10 meq via INTRAVENOUS
  Filled 2017-09-23: qty 100

## 2017-09-23 NOTE — ED Provider Notes (Signed)
[facility] EMERGENCY DEPARTMENT Provider Note   CSN: [id number] Arrival date & time: 09/23/17  0720     History   Chief Complaint No chief complaint on file.   HPI Nilani Hugill is a 67 y.o. female with a past medical history of diabetes hypertension, Graves' disease, who presents to ED for evaluation of multiple complaints. Her first complaint is sinus congestion, nasal congestion for the past 7 days. Also reports itchy, watery eyes, runny nose when going outside. She has tried over-the-counter saline drops, Motrin and Emergen-C with no improvement in her symptoms.  She states that she has never had a cold last this long so she is concerned.  She did not receive her influenza vaccine this year.  Denies any sick contacts with similar symptoms.  She denies any fevers, cough, chest pain, shortness of breath, wheezing. Her next complaint is generalized weakness.  She also reports right arm tingling for the past 2 days as well as myalgias and body cramps in her lower extremities.  She takes 20 mEq of potassium twice daily but is still concerned that her potassium is low.  She has been on this medication for several months.  She also reports "floaters" in her left eye that appeared earlier in the week.  She denies any blurry vision different from her baseline. She also reports one day history of dysuria.  She denies any head injuries, numbness in arms or legs, recent falls, blood thinner use, vomiting, diarrhea, abdominal pain, wheezing.  HPI  Past Medical History:  Diagnosis Date  . Diabetes mellitus without complication (HCC)    Type 2  . Graves disease   . Hypertension   . Thyroid disease     There are no active problems to display for this patient.   Past Surgical History:  Procedure Laterality Date  . VAGINAL HYSTERECTOMY      OB History    No data available       Home Medications    Prior to Admission medications   Medication Sig Start Date End  Date Taking? Authorizing Provider  amLODipine (NORVASC) 10 MG tablet Take 5 mg by mouth daily. Take one-half tablet (5mg ) at bedtime   Yes [provider]  aspirin EC 81 MG tablet Take 81 mg by mouth daily.   Yes [provider]  busPIRone (BUSPAR) 10 MG tablet Take 5 mg by mouth 2 (two) times daily.   Yes [provider]  carboxymethylcellulose (REFRESH PLUS) 0.5 % SOLN 1 drop daily as needed (dry eyes).   Yes [provider]  carvedilol (COREG) 25 MG tablet Take 50 mg by mouth 2 (two) times daily.    Yes [provider]  gabapentin (NEURONTIN) 100 MG capsule Take 200 mg by mouth 2 (two) times daily.    Yes [provider]  ibuprofen (ADVIL,MOTRIN) 200 MG tablet Take 200 mg by mouth every 6 (six) hours as needed for mild pain.   Yes [provider]  levothyroxine (SYNTHROID, LEVOTHROID) 88 MCG tablet Take 1 tablet (88 mcg total) by mouth daily before breakfast. 07/21/17  Yes Petrucelli, Samantha R, PA-C  lisinopril (PRINIVIL,ZESTRIL) 40 MG tablet Take 20 mg by mouth daily. Taking one-half tablet (20mg ) daily   Yes [provider]  loratadine (CLARITIN) 10 MG tablet Take 10 mg by mouth daily.   Yes [provider]  Lurasidone HCl (LATUDA PO) Take 1 tablet by mouth at bedtime.   Yes [provider]  MAGNESIUM PO Take  400 mg by mouth daily.    Yes [provider]  Menthol-Methyl Salicylate (ICY HOT EXTRA STRENGTH) 10-30 % CREA Apply 1 application topically as needed (leg pain).   Yes [provider]  metFORMIN (GLUCOPHAGE) 500 MG tablet Take 1 tablet by mouth 2 (two) times daily.    Yes [provider]  ondansetron (ZOFRAN ODT) 4 MG disintegrating tablet Take 1 tablet (4 mg total) by mouth every 8 (eight) hours as needed for nausea or vomiting. 06/08/16  Yes Alvira MondaySchlossman, Erin, MD  Phenylephrine-DM-GG-APAP (TYLENOL COLD MULTI-SYMPTOM DAY PO) Take 1 tablet by mouth as needed (cold symptoms).    Yes [provider]  polyethylene glycol (MIRALAX / GLYCOLAX) packet Take 17 g by mouth daily as needed for mild constipation.   Yes [provider]  potassium chloride SA (K-DUR,KLOR-CON) 20 MEQ tablet Take 20 mEq by mouth 2 (two) times daily.   Yes [provider]  PRAVASTATIN SODIUM PO Take 1 tablet by mouth daily.   Yes [provider]  sodium chloride (OCEAN) 0.65 % SOLN nasal spray Place 1 spray into both nostrils as needed for congestion.   Yes [provider]  traZODone (DESYREL) 50 MG tablet Take 0.5 tablets by mouth at bedtime as needed (sleep).    Yes [provider]  lisinopril (PRINIVIL,ZESTRIL) 10 MG tablet Take 3 tablets (30 mg total) by mouth daily. Patient not taking: Reported on 09/23/2017 06/08/16 09/23/17  Alvira MondaySchlossman, Erin, MD    Family History No family history on file.  Social History Social History   Tobacco Use  . Smoking status: Never Smoker  . Smokeless tobacco: Never Used  Substance Use Topics  . Alcohol use: No  . Drug use: Not on file     Allergies   Sudafed [pseudoephedrine hcl]   Review of Systems Review of Systems  Constitutional: Positive for activity change. Negative for appetite change, chills and fever.  HENT: Positive for congestion, sinus pressure and sinus pain. Negative for ear pain, rhinorrhea, sneezing and sore throat.   Eyes: Negative for photophobia and visual disturbance.  Respiratory: Negative for cough, chest tightness, shortness of breath and wheezing.   Cardiovascular: Negative for chest pain and palpitations.  Gastrointestinal: Negative for abdominal pain, blood in stool, constipation, diarrhea, nausea and vomiting.  Genitourinary: Positive for dysuria. Negative for hematuria and urgency.  Musculoskeletal: Positive for myalgias.  Skin: Negative for rash.  Neurological: Positive for weakness. Negative for dizziness, syncope and light-headedness.     Physical Exam Updated  Vital Signs BP (!) 158/81   Pulse 63   Temp 98.2 F (36.8 C) (Oral)   Resp 11   SpO2 100%   Physical Exam  Constitutional: She is oriented to person, place, and time. She appears well-developed and well-nourished. No distress.  Nontoxic appearing and in no acute distress.  HENT:  Head: Normocephalic and atraumatic.  Nose: Nose normal.  Eyes: Conjunctivae and EOM are normal. Pupils are equal, round, and reactive to light. Right eye exhibits no discharge. Left eye exhibits no discharge. No scleral icterus.  Neck: Normal range of motion. Neck supple.  Cardiovascular: Normal rate, regular rhythm, normal heart sounds and intact distal pulses. Exam reveals no gallop and no friction rub.  No murmur heard. Pulmonary/Chest: Effort normal and breath sounds normal. No respiratory distress.  Abdominal: Soft. Bowel sounds are normal. She exhibits no distension. There is no tenderness. There is no guarding.  Musculoskeletal: Normal range of motion. She exhibits no edema.  Neurological: She is  alert and oriented to person, place, and time. No cranial nerve deficit or sensory deficit. She exhibits normal muscle tone. Coordination normal.  Alert and oriented to person, place, time and situation.  Pupils reactive. No facial asymmetry noted. Cranial nerves appear grossly intact. Sensation intact to light touch on face, BUE and BLE. Strength 5/5 in BUE and BLE.  Normal finger to nose coordination bilaterally.  Skin: Skin is warm and dry. No rash noted.  Psychiatric: She has a normal mood and affect.  Nursing note and vitals reviewed.    ED Treatments / Results  Labs (all labs ordered are listed, but only abnormal results are displayed) Labs Reviewed  BASIC METABOLIC PANEL - Abnormal; Notable for the following components:      Result Value   Potassium 3.0 (*)    Chloride 100 (*)    Glucose, Bld 152 (*)    All other components within normal limits  CBC - Abnormal; Notable for the following  components:   RDW 16.2 (*)    All other components within normal limits  URINALYSIS, ROUTINE W REFLEX MICROSCOPIC - Abnormal; Notable for the following components:   Color, Urine COLORLESS (*)    Specific Gravity, Urine 1.002 (*)    Hgb urine dipstick SMALL (*)    Ketones, ur 5 (*)    All other components within normal limits  T4, FREE - Abnormal; Notable for the following components:   Free T4 1.24 (*)    All other components within normal limits  CBG MONITORING, ED - Abnormal; Notable for the following components:   Glucose-Capillary 142 (*)    All other components within normal limits  TSH  I-STAT TROPONIN, ED    EKG  EKG Interpretation None       Radiology Ct Head Wo Contrast  Result Date: 09/23/2017 CLINICAL DATA:  Two days weakness, fatigue EXAM: CT HEAD WITHOUT CONTRAST TECHNIQUE: Contiguous axial images were obtained from the base of the skull through the vertex without intravenous contrast. COMPARISON:  06/08/2016 FINDINGS: Brain: No acute intracranial abnormality. Specifically, no hemorrhage, hydrocephalus, mass lesion, acute infarction, or significant intracranial injury. Vascular: No hyperdense vessel or unexpected calcification. Skull: No acute calvarial abnormality. Sinuses/Orbits: Visualized paranasal sinuses and mastoids clear. Orbital soft tissues unremarkable. Other: None IMPRESSION: No acute intracranial abnormality. Electronically Signed   By: Charlett Nose M.D.   On: 09/23/2017 08:58    Procedures Procedures (including critical care time)  Medications Ordered in ED Medications  potassium chloride 10 mEq in 100 mL IVPB (0 mEq Intravenous Stopped 09/23/17 1145)     Initial Impression / Assessment and Plan / ED Course  I have reviewed the triage vital signs and the nursing notes.  Pertinent labs & imaging results that were available during my care of the patient were reviewed by me and considered in my medical decision making (see chart for details).      Patient presents to ED for evaluation of multiple complaints including generalized weakness, allergic rhinitis symptoms, nasal congestion, feelings of possibly having low potassium.  She does have a history of hypokalemia which she is on 20 mEq twice daily for as prescribed by her PCP.  Denies any blurry vision, numbness in arms or legs, recent falls, blood thinner use, vomiting, diarrhea, abdominal pain or chest pain.  Physical exam she is overall well-appearing.  She has no deficits on her neurological exam.  Her lab work with the exception of mild hypokalemia at 3.0 was unremarkable.  CT of the head  was done to evaluate for acute intracranial abnormality causing her weakness, which also returned as negative.  TSH, free T4 were both within normal limits.  Suspect that her symptoms could be due to a viral illness, possibly her chronic hypokalemia.  This is repleted here IV and I encouraged her to continue her home supplementation as needed.  Otherwise, her lab work is reassuring.  I reassured the patient of her workup and told her that would be best to follow-up with her primary care provider for further evaluation. Patient appears stable for discharge at this time. Strict return precautions given. Patient discussed with and seen by Dr. Jacqulyn Bath.  Portions of this note were generated with Scientist, clinical (histocompatibility and immunogenetics). Dictation errors may occur despite best attempts at proofreading.   Final Clinical Impressions(s) / ED Diagnoses   Final diagnoses:  Hypokalemia    ED Discharge Orders    None       Dietrich Pates, PA-C 09/23/17 1417    Maia Plan, MD 09/24/17 [phone]

## 2017-09-23 NOTE — ED Triage Notes (Signed)
Patient complains of 2 days of weakness and fatigue with body cramps. States that she thinks her potassium is low. Reports that she takes her BP meds daily

## 2017-09-23 NOTE — ED Notes (Signed)
ED Provider at bedside. 

## 2017-09-23 NOTE — Discharge Instructions (Signed)
Please read attached information regarding your condition. Your potassium was mildly low at 3.0 today.  We repleted it through your IV.  Continue your home potassium supplementation as directed by your primary care doctor. Continue your other home medications as previously prescribed. Take a daily antihistamine such as Zyrtec, Claritin or Allegra to help with your allergy symptoms. Return to ED for worsening symptoms, increasing weakness or numbness in legs, headache, vision changes, chest pain, shortness of breath or severe abdominal pain.

## 2017-09-23 NOTE — ED Notes (Signed)
Pharmacy at bedside

## 2017-09-23 NOTE — ED Notes (Signed)
Pt able to ambulate upon discharge.

## 2018-04-29 ENCOUNTER — Encounter (HOSPITAL_COMMUNITY): Payer: Self-pay | Admitting: *Deleted

## 2018-04-29 ENCOUNTER — Emergency Department (HOSPITAL_COMMUNITY)
Admission: EM | Admit: 2018-04-29 | Discharge: 2018-04-29 | Disposition: A | Discharge status: Home / Self Care | Payer: Non-veteran care | Attending: Emergency Medicine | Admitting: Emergency Medicine

## 2018-04-29 ENCOUNTER — Emergency Department (HOSPITAL_COMMUNITY): Payer: Non-veteran care

## 2018-04-29 DIAGNOSIS — E119 Type 2 diabetes mellitus without complications: Secondary | ICD-10-CM | POA: Diagnosis not present

## 2018-04-29 DIAGNOSIS — Z7984 Long term (current) use of oral hypoglycemic drugs: Secondary | ICD-10-CM | POA: Diagnosis not present

## 2018-04-29 DIAGNOSIS — R197 Diarrhea, unspecified: Secondary | ICD-10-CM | POA: Insufficient documentation

## 2018-04-29 DIAGNOSIS — Z79899 Other long term (current) drug therapy: Secondary | ICD-10-CM | POA: Insufficient documentation

## 2018-04-29 DIAGNOSIS — E039 Hypothyroidism, unspecified: Secondary | ICD-10-CM | POA: Insufficient documentation

## 2018-04-29 DIAGNOSIS — R1032 Left lower quadrant pain: Secondary | ICD-10-CM | POA: Diagnosis not present

## 2018-04-29 DIAGNOSIS — I10 Essential (primary) hypertension: Secondary | ICD-10-CM | POA: Diagnosis not present

## 2018-04-29 DIAGNOSIS — R112 Nausea with vomiting, unspecified: Secondary | ICD-10-CM | POA: Diagnosis not present

## 2018-04-29 DIAGNOSIS — Z7982 Long term (current) use of aspirin: Secondary | ICD-10-CM | POA: Diagnosis not present

## 2018-04-29 LAB — COMPREHENSIVE METABOLIC PANEL
ALBUMIN: 4.3 g/dL (ref 3.5–5.0)
ALT: 23 U/L (ref 0–44)
ANION GAP: 11 (ref 5–15)
AST: 28 U/L (ref 15–41)
Alkaline Phosphatase: 75 U/L (ref 38–126)
BILIRUBIN TOTAL: 0.7 mg/dL (ref 0.3–1.2)
BUN: 7 mg/dL — AB (ref 8–23)
CO2: 28 mmol/L (ref 22–32)
Calcium: 9.7 mg/dL (ref 8.9–10.3)
Chloride: 102 mmol/L (ref 98–111)
Creatinine, Ser: 0.97 mg/dL (ref 0.44–1.00)
GFR calc Af Amer: >60 mL/min (ref >60)
GFR, EST NON AFRICAN AMERICAN: 60 mL/min — AB (ref >60)
GLUCOSE: 122 mg/dL — AB (ref 70–99)
POTASSIUM: 3.5 mmol/L (ref 3.5–5.1)
Sodium: 141 mmol/L (ref 135–145)
TOTAL PROTEIN: 7.9 g/dL (ref 6.5–8.1)

## 2018-04-29 LAB — URINALYSIS, ROUTINE W REFLEX MICROSCOPIC
BILIRUBIN URINE: NEGATIVE
GLUCOSE, UA: NEGATIVE mg/dL
Hgb urine dipstick: NEGATIVE
Ketones, ur: NEGATIVE mg/dL
NITRITE: NEGATIVE
PH: 9 — AB (ref 5.0–8.0)
Protein, ur: NEGATIVE mg/dL
SPECIFIC GRAVITY, URINE: 1.006 (ref 1.005–1.030)

## 2018-04-29 LAB — LIPASE, BLOOD: Lipase: 33 U/L (ref 11–51)

## 2018-04-29 LAB — CBC
HCT: 44.1 % (ref 36.0–46.0)
HEMOGLOBIN: 14.1 g/dL (ref 12.0–15.0)
MCH: 26.6 pg (ref 26.0–34.0)
MCHC: 32 g/dL (ref 30.0–36.0)
MCV: 83.1 fL (ref 78.0–100.0)
Platelets: 273 10*3/uL (ref 150–400)
RBC: 5.31 MIL/uL — ABNORMAL HIGH (ref 3.87–5.11)
RDW: 15.5 % (ref 11.5–15.5)
WBC: 7.4 10*3/uL (ref 4.0–10.5)

## 2018-04-29 LAB — I-STAT TROPONIN, ED: Troponin i, poc: 0 ng/mL (ref 0.00–0.08)

## 2018-04-29 LAB — TSH: TSH: 11.511 u[IU]/mL — ABNORMAL HIGH (ref 0.350–4.500)

## 2018-04-29 LAB — MAGNESIUM: Magnesium: 1.9 mg/dL (ref 1.7–2.4)

## 2018-04-29 MED ORDER — ONDANSETRON HCL 4 MG/2ML IJ SOLN
4.0000 mg | Freq: Once | INTRAMUSCULAR | Status: AC
Start: 2018-04-29 — End: 2018-04-29
  Administered 2018-04-29: 4 mg via INTRAVENOUS
  Filled 2018-04-29: qty 2

## 2018-04-29 MED ORDER — ONDANSETRON 4 MG PO TBDP: 4.0000 mg | ORAL_TABLET | Freq: Three times a day (TID) | ORAL | 0 refills | Status: DC | PRN

## 2018-04-29 MED ORDER — MORPHINE SULFATE (PF) 4 MG/ML IV SOLN
4.0000 mg | Freq: Once | INTRAVENOUS | Status: AC
  Administered 2018-04-29: 4 mg via INTRAVENOUS
  Filled 2018-04-29: qty 1

## 2018-04-29 MED ORDER — IOHEXOL 300 MG/ML  SOLN
100.0000 mL | Freq: Once | INTRAMUSCULAR | Status: AC | PRN
  Administered 2018-04-29: 100 mL via INTRAVENOUS

## 2018-04-29 MED ORDER — SODIUM CHLORIDE 0.9 % IV BOLUS
1000.0000 mL | Freq: Once | INTRAVENOUS | Status: AC
  Administered 2018-04-29: 1000 mL via INTRAVENOUS

## 2018-04-29 NOTE — ED Triage Notes (Signed)
Pt in c/o chills, diarrhea, n/v, and body aches for the last 4 days

## 2018-04-29 NOTE — ED Notes (Signed)
Patient transported to CT 

## 2018-04-29 NOTE — ED Provider Notes (Signed)
[facility]   CSN: [id number] Arrival date & time: 04/29/18  1014     History   Chief Complaint Chief Complaint  Patient presents with  . Abdominal Pain    HPI Stacy Sizerarline Singley is a 67 y.o. female.  She has a history of hypertension diabetes and thyroid disease.  She is complaining of 4 days of crampy abdominal pain diarrhea nausea fevers and chills.  She is lost her appetite.  No sick contacts.  She has tried nothing for this.  Her pain is diffuse in her abdomen but mostly lower.  It started more towards her left flank.  No urinary symptoms no vaginal bleeding no vaginal discharge.  The history is provided by the patient.  Abdominal Pain   This is a new problem. Episode onset: 4 days. The problem occurs constantly. The problem has not changed since onset.The pain is associated with an unknown factor. The pain is located in the LLQ. The quality of the pain is aching. The pain is moderate. Associated symptoms include fever, diarrhea and nausea. Pertinent negatives include constipation, dysuria, frequency, hematuria and headaches. The symptoms are aggravated by eating. Nothing relieves the symptoms.    Past Medical History:  Diagnosis Date  . Diabetes mellitus without complication (HCC)    Type 2  . Graves disease   . Hypertension   . Thyroid disease     There are no active problems to display for this patient.   Past Surgical History:  Procedure Laterality Date  . VAGINAL HYSTERECTOMY       OB History   None      Home Medications    Prior to Admission medications   Medication Sig Start Date End Date Taking? Authorizing Provider  amLODipine (NORVASC) 10 MG tablet Take 5 mg by mouth daily. Take one-half tablet (5mg ) at bedtime    [provider]  aspirin EC 81 MG tablet Take 81 mg by mouth daily.    [provider]  busPIRone (BUSPAR) 10 MG tablet Take 5 mg by mouth 2 (two) times daily.    [provider]  carboxymethylcellulose (REFRESH PLUS) 0.5 % SOLN 1 drop daily as needed (dry eyes).    [provider]  carvedilol (COREG) 25 MG tablet Take 50 mg by mouth 2 (two) times daily.     [provider]  gabapentin (NEURONTIN) 100 MG capsule Take 200 mg by mouth 2 (two) times daily.     [provider]  ibuprofen (ADVIL,MOTRIN) 200 MG tablet Take 200 mg by mouth every 6 (six) hours as needed for mild pain.    [provider]  levothyroxine (SYNTHROID, LEVOTHROID) 88 MCG tablet Take 1 tablet (88 mcg total) by mouth daily before breakfast. 07/21/17   Petrucelli, Samantha R, PA-C  lisinopril (PRINIVIL,ZESTRIL) 10 MG tablet Take 3 tablets (30 mg total) by mouth daily. Patient not taking: Reported on 09/23/2017 06/08/16 09/23/17  Alvira MondaySchlossman, Erin, MD  lisinopril (PRINIVIL,ZESTRIL) 40 MG tablet Take 20 mg by mouth daily. Taking one-half tablet (20mg ) daily    [provider]  loratadine (CLARITIN) 10 MG tablet Take 10 mg by mouth daily.    [provider]  Lurasidone HCl (LATUDA PO) Take 1 tablet by mouth at bedtime.    [provider]  MAGNESIUM PO Take 400 mg by mouth daily.     [provider]  Menthol-Methyl Salicylate (ICY HOT EXTRA STRENGTH) 10-30 % CREA Apply 1 application topically as needed (  leg pain).    [provider]  metFORMIN (GLUCOPHAGE) 500 MG tablet Take 1 tablet by mouth 2 (two) times daily.     [provider]  ondansetron (ZOFRAN ODT) 4 MG disintegrating tablet Take 1 tablet (4 mg total) by mouth every 8 (eight) hours as needed for nausea or vomiting. 06/08/16   Alvira Monday, MD  Phenylephrine-DM-GG-APAP (TYLENOL COLD MULTI-SYMPTOM DAY PO) Take 1 tablet by mouth as needed (cold symptoms).    [provider]  polyethylene glycol (MIRALAX / GLYCOLAX) packet Take 17 g by mouth daily as needed for mild constipation.    [provider]  potassium chloride SA  (K-DUR,KLOR-CON) 20 MEQ tablet Take 20 mEq by mouth 2 (two) times daily.    [provider]  PRAVASTATIN SODIUM PO Take 1 tablet by mouth daily.    [provider]  sodium chloride (OCEAN) 0.65 % SOLN nasal spray Place 1 spray into both nostrils as needed for congestion.    [provider]  traZODone (DESYREL) 50 MG tablet Take 0.5 tablets by mouth at bedtime as needed (sleep).     [provider]    Family History History reviewed. No pertinent family history.  Social History Social History   Tobacco Use  . Smoking status: Never Smoker  . Smokeless tobacco: Never Used  Substance Use Topics  . Alcohol use: No  . Drug use: Not on file     Allergies   Sudafed [pseudoephedrine hcl]   Review of Systems Review of Systems  Constitutional: Positive for fever.  HENT: Negative for sore throat.   Eyes: Negative for visual disturbance.  Respiratory: Negative for shortness of breath.   Cardiovascular: Negative for chest pain.  Gastrointestinal: Positive for abdominal pain, diarrhea and nausea. Negative for constipation.  Genitourinary: Negative for dysuria, frequency and hematuria.  Musculoskeletal: Negative for neck pain.  Skin: Negative for rash.  Neurological: Negative for headaches.     Physical Exam Updated Vital Signs BP (!) 176/94 (BP Location: Right Arm)   Pulse 60   Temp 98.1 F (36.7 C) (Oral)   Resp 16   SpO2 99%   Physical Exam  Constitutional: She is oriented to person, place, and time. She appears well-developed and well-nourished. No distress.  HENT:  Head: Normocephalic and atraumatic.  Eyes: Conjunctivae are normal.  Neck: Neck supple.  Cardiovascular: Normal rate and regular rhythm.  No murmur heard. Pulmonary/Chest: Effort normal and breath sounds normal. No respiratory distress.  Abdominal: Soft. There is tenderness in the left lower quadrant. There is no rigidity, no rebound and no guarding.  Musculoskeletal: She  exhibits no edema, tenderness or deformity.  Neurological: She is alert and oriented to person, place, and time. She has normal strength. GCS eye subscore is 4. GCS verbal subscore is 5. GCS motor subscore is 6.  Skin: Skin is warm and dry.  Psychiatric: She has a normal mood and affect.  Nursing note and vitals reviewed.    ED Treatments / Results  Labs (all labs ordered are listed, but only abnormal results are displayed) Labs Reviewed  COMPREHENSIVE METABOLIC PANEL - Abnormal; Notable for the following components:      Result Value   Glucose, Bld 122 (*)    BUN 7 (*)    GFR calc non Af Amer 60 (*)    All other components within normal limits  CBC - Abnormal; Notable for the following components:   RBC 5.31 (*)    All other components within normal  limits  URINALYSIS, ROUTINE W REFLEX MICROSCOPIC - Abnormal; Notable for the following components:   Color, Urine STRAW (*)    pH 9.0 (*)    Leukocytes, UA SMALL (*)    Bacteria, UA RARE (*)    All other components within normal limits  TSH - Abnormal; Notable for the following components:   TSH 11.511 (*)    All other components within normal limits  LIPASE, BLOOD  MAGNESIUM  I-STAT TROPONIN, ED    EKG EKG Interpretation  Date/Time:  Monday April 29 2018 15:28:46 EDT Ventricular Rate:  56 PR Interval:    QRS Duration: 95 QT Interval:  458 QTC Calculation: 442 R Axis:   43 Text Interpretation:  Sinus rhythm Probable left atrial enlargement Nonspecific T abnormalities, diffuse leads stable from earlier today Confirmed by Meridee Score [phone]) on 04/29/2018 3:31:28 PM   Radiology Ct Abdomen Pelvis W Contrast  Result Date: 04/29/2018 CLINICAL DATA:  Left lower quadrant pain nausea, vomiting and diarrhea. EXAM: CT ABDOMEN AND PELVIS WITH CONTRAST TECHNIQUE: Multidetector CT imaging of the abdomen and pelvis was performed using the standard protocol following bolus administration of intravenous contrast. CONTRAST:   OMNIPAQUE IOHEXOL 300 MG/ML  SOLN COMPARISON:  CT abdomen pelvis 06/08/2016 FINDINGS: LOWER CHEST: There is no basilar pleural or apical pericardial effusion. HEPATOBILIARY: The hepatic contours and density are normal. There is no intra- or extrahepatic biliary dilatation. The gallbladder is normal. PANCREAS: The pancreatic parenchymal contours are normal and there is no ductal dilatation. There is no peripancreatic fluid collection. SPLEEN: Normal. ADRENALS/URINARY TRACT: --Adrenal glands: Normal. --Right kidney/ureter: No hydronephrosis, nephroureterolithiasis, perinephric stranding or solid renal mass. --Left kidney/ureter: No hydronephrosis, nephroureterolithiasis, perinephric stranding or solid renal mass. 3.1 cm left renal cyst. --Urinary bladder: Normal for degree of distention STOMACH/BOWEL: --Stomach/Duodenum: There is no hiatal hernia or other gastric abnormality. The duodenal course and caliber are normal. --Small bowel: No dilatation or inflammation. --Colon: No focal abnormality. --Appendix: Normal. VASCULAR/LYMPHATIC: Atherosclerotic calcification is present within the non-aneurysmal abdominal aorta, without hemodynamically significant stenosis. The portal vein, splenic vein, superior mesenteric vein and IVC are patent. No abdominal or pelvic lymphadenopathy. REPRODUCTIVE: Status post hysterectomy. No adnexal mass. MUSCULOSKELETAL. No bony spinal canal stenosis or focal osseous abnormality. OTHER: None. IMPRESSION: No acute abnormality of the abdomen or pelvis. Electronically Signed   By: Deatra Robinson M.D.   On: 04/29/2018 16:25    Procedures Procedures (including critical care time)  Medications Ordered in ED Medications  sodium chloride 0.9 % bolus 1,000 mL (has no administration in time range)  ondansetron (ZOFRAN) injection 4 mg (has no administration in time range)  morphine 4 MG/ML injection 4 mg (has no administration in time range)     Initial Impression / Assessment and  Plan / ED Course  I have reviewed the triage vital signs and the nursing notes.  Pertinent labs & imaging results that were available during my care of the patient were reviewed by me and considered in my medical decision making (see chart for details).  Clinical Course as of Apr 30 805  Mon Apr 29, 2018  1629 TSH high at 11.51.   [MB]  1629 CT abdomen and pelvis with no acute findings.   [MB]    Clinical Course User Index [MB] Terrilee Files, MD     Final Clinical Impressions(s) / ED Diagnoses   Final diagnoses:  Left lower quadrant pain  Nausea vomiting and diarrhea  Hypothyroidism, unspecified type    ED Discharge Orders  Ordered    ondansetron (ZOFRAN ODT) 4 MG disintegrating tablet  Every 8 hours PRN     04/29/18 1657           Terrilee Files, MD 04/30/18 [phone]

## 2018-04-29 NOTE — Discharge Instructions (Addendum)
You were evaluated in the emergency department for 4 days of nausea vomiting diarrhea and abdominal pain.  You had blood work EKG and a CAT scan that did not show an obvious cause of your symptoms.  We also checked your thyroid test and your TSH was elevated at 11.51.  You should inform your doctor of this and also follow-up with them for your abdominal symptoms.  We are prescribing some nausea medication to use as needed.  Please return to the emergency department if any worsening symptoms.

## 2018-05-01 ENCOUNTER — Emergency Department (HOSPITAL_COMMUNITY)
Admission: EM | Admit: 2018-05-01 | Discharge: 2018-05-01 | Disposition: A | Discharge status: Home / Self Care | Payer: Medicare Other | Attending: Emergency Medicine | Admitting: Emergency Medicine

## 2018-05-01 ENCOUNTER — Encounter (HOSPITAL_COMMUNITY): Payer: Self-pay

## 2018-05-01 DIAGNOSIS — Z79899 Other long term (current) drug therapy: Secondary | ICD-10-CM | POA: Diagnosis not present

## 2018-05-01 DIAGNOSIS — Z7984 Long term (current) use of oral hypoglycemic drugs: Secondary | ICD-10-CM | POA: Insufficient documentation

## 2018-05-01 DIAGNOSIS — I10 Essential (primary) hypertension: Secondary | ICD-10-CM | POA: Diagnosis not present

## 2018-05-01 DIAGNOSIS — Z7982 Long term (current) use of aspirin: Secondary | ICD-10-CM | POA: Insufficient documentation

## 2018-05-01 DIAGNOSIS — K529 Noninfective gastroenteritis and colitis, unspecified: Secondary | ICD-10-CM | POA: Diagnosis not present

## 2018-05-01 DIAGNOSIS — E119 Type 2 diabetes mellitus without complications: Secondary | ICD-10-CM | POA: Insufficient documentation

## 2018-05-01 DIAGNOSIS — R112 Nausea with vomiting, unspecified: Secondary | ICD-10-CM | POA: Diagnosis present

## 2018-05-01 DIAGNOSIS — E079 Disorder of thyroid, unspecified: Secondary | ICD-10-CM | POA: Insufficient documentation

## 2018-05-01 LAB — COMPREHENSIVE METABOLIC PANEL
ALK PHOS: 67 U/L (ref 38–126)
ALT: 23 U/L (ref 0–44)
AST: 31 U/L (ref 15–41)
Albumin: 4 g/dL (ref 3.5–5.0)
Anion gap: 13 (ref 5–15)
BILIRUBIN TOTAL: 1.1 mg/dL (ref 0.3–1.2)
BUN: 7 mg/dL — AB (ref 8–23)
CALCIUM: 9.3 mg/dL (ref 8.9–10.3)
CO2: 27 mmol/L (ref 22–32)
CREATININE: 1.1 mg/dL — AB (ref 0.44–1.00)
Chloride: 99 mmol/L (ref 98–111)
GFR calc Af Amer: 59 mL/min — ABNORMAL LOW (ref >60)
GFR, EST NON AFRICAN AMERICAN: 51 mL/min — AB (ref >60)
Glucose, Bld: 184 mg/dL — ABNORMAL HIGH (ref 70–99)
POTASSIUM: 3.2 mmol/L — AB (ref 3.5–5.1)
Sodium: 139 mmol/L (ref 135–145)
TOTAL PROTEIN: 7.2 g/dL (ref 6.5–8.1)

## 2018-05-01 LAB — URINALYSIS, ROUTINE W REFLEX MICROSCOPIC
BILIRUBIN URINE: NEGATIVE
Glucose, UA: NEGATIVE mg/dL
Hgb urine dipstick: NEGATIVE
KETONES UR: 20 mg/dL — AB
LEUKOCYTES UA: NEGATIVE
NITRITE: NEGATIVE
PROTEIN: NEGATIVE mg/dL
Specific Gravity, Urine: 1.012 (ref 1.005–1.030)
pH: 7 (ref 5.0–8.0)

## 2018-05-01 LAB — CBC
HCT: 41.3 % (ref 36.0–46.0)
Hemoglobin: 13.3 g/dL (ref 12.0–15.0)
MCH: 26.7 pg (ref 26.0–34.0)
MCHC: 32.2 g/dL (ref 30.0–36.0)
MCV: 82.9 fL (ref 78.0–100.0)
PLATELETS: 263 10*3/uL (ref 150–400)
RBC: 4.98 MIL/uL (ref 3.87–5.11)
RDW: 15.6 % — AB (ref 11.5–15.5)
WBC: 6.2 10*3/uL (ref 4.0–10.5)

## 2018-05-01 LAB — LIPASE, BLOOD: Lipase: 33 U/L (ref 11–51)

## 2018-05-01 MED ORDER — SODIUM CHLORIDE 0.9 % IV BOLUS
1000.0000 mL | Freq: Once | INTRAVENOUS | Status: AC
  Administered 2018-05-01: 1000 mL via INTRAVENOUS

## 2018-05-01 MED ORDER — ONDANSETRON HCL 4 MG/2ML IJ SOLN
4.0000 mg | Freq: Once | INTRAMUSCULAR | Status: AC
  Administered 2018-05-01: 4 mg via INTRAVENOUS
  Filled 2018-05-01: qty 2

## 2018-05-01 MED ORDER — LISINOPRIL 20 MG PO TABS
40.0000 mg | ORAL_TABLET | Freq: Once | ORAL | Status: AC
  Administered 2018-05-01: 40 mg via ORAL
  Filled 2018-05-01: qty 2

## 2018-05-01 NOTE — ED Notes (Signed)
The PA at bedside,

## 2018-05-01 NOTE — Discharge Instructions (Addendum)
Fill your prescription for Zofran and take as directed for nausea. Push fluids. Advance your diet as you are able. Follow up with your doctor in 2 days for recheck of symptoms. Increase your potassium from 20 mEq twice daily to 40 mEq twice daily for the next 2 days.

## 2018-05-01 NOTE — ED Triage Notes (Signed)
Pt states that she was seen here two days ago for the same, continues to have n/v/d and abd pain. Has not got prescriptions filled yet

## 2018-05-01 NOTE — ED Notes (Signed)
Patient ambulated to the restroom with one assist.  

## 2018-05-01 NOTE — ED Provider Notes (Signed)
[facility] Provider Note   CSN: [id number] Arrival date & time: 05/01/18  0108     History   Chief Complaint Chief Complaint  Patient presents with  . Emesis    HPI Stacy Sizerarline Gillham is a 67 y.o. female.  Patient to ED with nausea, vomiting and diarrhea for the past one week. No fever. She initially had abdominal discomfort described as cramping that is better now but vomiting and diarrhea persist. She was seen for same symptoms in the ED on 9/17 and was discharged home with Zofran which she could not get filled. She reports 2 episodes daily of non-bloody emesis and stools. She have been able to continue taking her medications but reports she has no appetite and fluids have been difficult due to nausea.  The history is provided by the patient. No language interpreter was used.  Emesis   Associated symptoms include diarrhea. Pertinent negatives include no chills and no fever.    Past Medical History:  Diagnosis Date  . Diabetes mellitus without complication (HCC)    Type 2  . Graves disease   . Hypertension   . Thyroid disease     There are no active problems to display for this patient.   Past Surgical History:  Procedure Laterality Date  . VAGINAL HYSTERECTOMY       OB History   None      Home Medications    Prior to Admission medications   Medication Sig Start Date End Date Taking? Authorizing Provider  amLODipine (NORVASC) 10 MG tablet Take 5 mg by mouth at bedtime.     [provider]  aspirin EC 81 MG tablet Take 81 mg by mouth daily.    [provider]  busPIRone (BUSPAR) 10 MG tablet Take 5 mg by mouth 2 (two) times daily.    [provider]  carboxymethylcellulose (REFRESH PLUS) 0.5 % SOLN Place 1 drop into both eyes daily as needed (dry eyes).     [provider]  carvedilol (COREG) 25 MG tablet Take 50 mg by mouth 2 (two) times daily.     [provider]  Cetirizine HCl  (ZYRTEC PO) Take 1 tablet by mouth 2 (two) times daily as needed (allergies).     [provider]  gabapentin (NEURONTIN) 100 MG capsule Take 200 mg by mouth 2 (two) times daily.     [provider]  ibuprofen (ADVIL,MOTRIN) 200 MG tablet Take 200 mg by mouth every 6 (six) hours as needed for mild pain.    [provider]  levothyroxine (SYNTHROID, LEVOTHROID) 88 MCG tablet Take 1 tablet (88 mcg total) by mouth daily before breakfast. 07/21/17   Petrucelli, Samantha R, PA-C  lisinopril (PRINIVIL,ZESTRIL) 10 MG tablet Take 3 tablets (30 mg total) by mouth daily. Patient taking differently: Take 40 mg by mouth daily.  06/08/16 04/29/18  Alvira MondaySchlossman, Erin, MD  Lurasidone HCl (LATUDA PO) Take 1 tablet by mouth at bedtime.    [provider]  MAGNESIUM PO Take 400 mg by mouth daily.     [provider]  metFORMIN (GLUCOPHAGE) 500 MG tablet Take 500 mg by mouth 2 (two) times daily.     [provider]  ondansetron (ZOFRAN ODT) 4 MG disintegrating tablet Take 1 tablet (4 mg total) by mouth every 8 (eight) hours as needed for nausea or vomiting. 04/29/18   Terrilee FilesButler, Michael C, MD  potassium chloride SA (K-DUR,KLOR-CON) 20 MEQ tablet Take 20 mEq by mouth  2 (two) times daily.    [provider]  pravastatin (PRAVACHOL) 80 MG tablet Take 80 mg by mouth daily.     [provider]  sodium chloride (OCEAN) 0.65 % SOLN nasal spray Place 1 spray into both nostrils as needed for congestion. Flonase    [provider]  traZODone (DESYREL) 50 MG tablet Take 25 mg by mouth at bedtime as needed (sleep).     [provider]    Family History No family history on file.  Social History Social History   Tobacco Use  . Smoking status: Never Smoker  . Smokeless tobacco: Never Used  Substance Use Topics  . Alcohol use: No  . Drug use: Not on file     Allergies   Sudafed [pseudoephedrine hcl]   Review of Systems Review of  Systems  Constitutional: Negative for chills and fever.  HENT: Negative.   Respiratory: Negative.   Cardiovascular: Negative.   Gastrointestinal: Positive for diarrhea, nausea and vomiting. Negative for abdominal distention.  Genitourinary: Negative.  Negative for decreased urine volume.  Musculoskeletal: Negative.   Skin: Negative.   Neurological: Positive for weakness. Negative for syncope.     Physical Exam Updated Vital Signs BP (!) 208/88   Pulse (!) 52   Temp 98.1 F (36.7 C) (Oral)   Resp 18   SpO2 100%   Physical Exam  Constitutional: She is oriented to person, place, and time. She appears well-developed and well-nourished.  HENT:  Head: Normocephalic.  Mouth/Throat: Mucous membranes are dry.  Neck: Normal range of motion. Neck supple.  Cardiovascular: Normal rate and regular rhythm.  Pulmonary/Chest: Effort normal and breath sounds normal. She has no wheezes. She has no rales.  Abdominal: Soft. Bowel sounds are normal. She exhibits no distension. There is no tenderness. There is no rebound and no guarding.  Musculoskeletal: Normal range of motion.  Neurological: She is alert and oriented to person, place, and time.  Skin: Skin is warm and dry. No rash noted.  Psychiatric: She has a normal mood and affect.     ED Treatments / Results  Labs (all labs ordered are listed, but only abnormal results are displayed) Labs Reviewed  COMPREHENSIVE METABOLIC PANEL - Abnormal; Notable for the following components:      Result Value   Potassium 3.2 (*)    Glucose, Bld 184 (*)    BUN 7 (*)    Creatinine, Ser 1.10 (*)    GFR calc non Af Amer 51 (*)    GFR calc Af Amer 59 (*)    All other components within normal limits  CBC - Abnormal; Notable for the following components:   RDW 15.6 (*)    All other components within normal limits  URINALYSIS, ROUTINE W REFLEX MICROSCOPIC - Abnormal; Notable for the following components:   Ketones, ur 20 (*)    All other components  within normal limits  LIPASE, BLOOD    EKG None  Radiology Ct Abdomen Pelvis W Contrast  Result Date: 04/29/2018 CLINICAL DATA:  Left lower quadrant pain nausea, vomiting and diarrhea. EXAM: CT ABDOMEN AND PELVIS WITH CONTRAST TECHNIQUE: Multidetector CT imaging of the abdomen and pelvis was performed using the standard protocol following bolus administration of intravenous contrast. CONTRAST:  OMNIPAQUE IOHEXOL 300 MG/ML  SOLN COMPARISON:  CT abdomen pelvis 06/08/2016 FINDINGS: LOWER CHEST: There is no basilar pleural or apical pericardial effusion. HEPATOBILIARY: The hepatic contours and density are normal. There is no intra- or extrahepatic biliary dilatation.  The gallbladder is normal. PANCREAS: The pancreatic parenchymal contours are normal and there is no ductal dilatation. There is no peripancreatic fluid collection. SPLEEN: Normal. ADRENALS/URINARY TRACT: --Adrenal glands: Normal. --Right kidney/ureter: No hydronephrosis, nephroureterolithiasis, perinephric stranding or solid renal mass. --Left kidney/ureter: No hydronephrosis, nephroureterolithiasis, perinephric stranding or solid renal mass. 3.1 cm left renal cyst. --Urinary bladder: Normal for degree of distention STOMACH/BOWEL: --Stomach/Duodenum: There is no hiatal hernia or other gastric abnormality. The duodenal course and caliber are normal. --Small bowel: No dilatation or inflammation. --Colon: No focal abnormality. --Appendix: Normal. VASCULAR/LYMPHATIC: Atherosclerotic calcification is present within the non-aneurysmal abdominal aorta, without hemodynamically significant stenosis. The portal vein, splenic vein, superior mesenteric vein and IVC are patent. No abdominal or pelvic lymphadenopathy. REPRODUCTIVE: Status post hysterectomy. No adnexal mass. MUSCULOSKELETAL. No bony spinal canal stenosis or focal osseous abnormality. OTHER: None. IMPRESSION: No acute abnormality of the abdomen or pelvis. Electronically Signed   By: Deatra Robinson M.D.   On: 04/29/2018 16:25    Procedures Procedures (including critical care time)  Medications Ordered in ED Medications  sodium chloride 0.9 % bolus 1,000 mL (has no administration in time range)  lisinopril (PRINIVIL,ZESTRIL) tablet 40 mg (has no administration in time range)  ondansetron (ZOFRAN) injection 4 mg (has no administration in time range)     Initial Impression / Assessment and Plan / ED Course  I have reviewed the triage vital signs and the nursing notes.  Pertinent labs & imaging results that were available during my care of the patient were reviewed by me and considered in my medical decision making (see chart for details).     Patient to ED with persistent symptoms of vomiting and diarrhea. Chart reviewed. Visit on 04/30/18 included evaluation by CT scan which was negative for acute abdominal findings.   VSS, hypertensive in ED (takes meds in the mornings). IVF's provided with Zofran. She is given a PO challenge and is able to drink without vomiting.   She can be discharged home and is encouraged to follow up with primary care if symptoms persist.   Final Clinical Impressions(s) / ED Diagnoses   Final diagnoses:  None   1. gastroenteritis  ED Discharge Orders    None       Danne Harbor 05/01/18 1610    Nira Conn, MD 05/01/18 2322

## 2018-05-01 NOTE — ED Notes (Signed)
Patient reports she has not gotten her prescription filled [facility] so she is still having the nausea.

## 2018-07-23 ENCOUNTER — Emergency Department (HOSPITAL_COMMUNITY): Payer: Non-veteran care

## 2018-07-23 ENCOUNTER — Encounter (HOSPITAL_COMMUNITY): Payer: Self-pay | Admitting: Emergency Medicine

## 2018-07-23 ENCOUNTER — Emergency Department (HOSPITAL_COMMUNITY)
Admission: EM | Admit: 2018-07-23 | Discharge: 2018-07-23 | Disposition: A | Discharge status: Home / Self Care | Payer: Non-veteran care | Attending: Emergency Medicine | Admitting: Emergency Medicine

## 2018-07-23 DIAGNOSIS — E079 Disorder of thyroid, unspecified: Secondary | ICD-10-CM | POA: Insufficient documentation

## 2018-07-23 DIAGNOSIS — I10 Essential (primary) hypertension: Secondary | ICD-10-CM | POA: Insufficient documentation

## 2018-07-23 DIAGNOSIS — Z7984 Long term (current) use of oral hypoglycemic drugs: Secondary | ICD-10-CM | POA: Diagnosis not present

## 2018-07-23 DIAGNOSIS — Z79899 Other long term (current) drug therapy: Secondary | ICD-10-CM | POA: Insufficient documentation

## 2018-07-23 DIAGNOSIS — Z7982 Long term (current) use of aspirin: Secondary | ICD-10-CM | POA: Insufficient documentation

## 2018-07-23 DIAGNOSIS — I499 Cardiac arrhythmia, unspecified: Secondary | ICD-10-CM

## 2018-07-23 DIAGNOSIS — E119 Type 2 diabetes mellitus without complications: Secondary | ICD-10-CM | POA: Diagnosis not present

## 2018-07-23 DIAGNOSIS — R0602 Shortness of breath: Secondary | ICD-10-CM | POA: Diagnosis present

## 2018-07-23 DIAGNOSIS — R1011 Right upper quadrant pain: Secondary | ICD-10-CM | POA: Diagnosis not present

## 2018-07-23 DIAGNOSIS — R008 Other abnormalities of heart beat: Secondary | ICD-10-CM | POA: Diagnosis not present

## 2018-07-23 DIAGNOSIS — I498 Other specified cardiac arrhythmias: Secondary | ICD-10-CM

## 2018-07-23 LAB — COMPREHENSIVE METABOLIC PANEL
ALT: 44 U/L (ref 0–44)
AST: 39 U/L (ref 15–41)
Albumin: 3.9 g/dL (ref 3.5–5.0)
Alkaline Phosphatase: 76 U/L (ref 38–126)
Anion gap: 12 (ref 5–15)
BUN: 11 mg/dL (ref 8–23)
CO2: 23 mmol/L (ref 22–32)
Calcium: 9.1 mg/dL (ref 8.9–10.3)
Chloride: 102 mmol/L (ref 98–111)
Creatinine, Ser: 1.04 mg/dL — ABNORMAL HIGH (ref 0.44–1.00)
GFR calc Af Amer: >60 mL/min (ref >60)
GFR, EST NON AFRICAN AMERICAN: 56 mL/min — AB (ref >60)
Glucose, Bld: 137 mg/dL — ABNORMAL HIGH (ref 70–99)
POTASSIUM: 3.6 mmol/L (ref 3.5–5.1)
SODIUM: 137 mmol/L (ref 135–145)
Total Bilirubin: 0.8 mg/dL (ref 0.3–1.2)
Total Protein: 7.3 g/dL (ref 6.5–8.1)

## 2018-07-23 LAB — CBC WITH DIFFERENTIAL/PLATELET
ABS IMMATURE GRANULOCYTES: 0.01 10*3/uL (ref 0.00–0.07)
BASOS ABS: 0.1 10*3/uL (ref 0.0–0.1)
BASOS PCT: 1 %
EOS ABS: 0.1 10*3/uL (ref 0.0–0.5)
Eosinophils Relative: 1 %
HCT: 41.8 % (ref 36.0–46.0)
Hemoglobin: 12.9 g/dL (ref 12.0–15.0)
IMMATURE GRANULOCYTES: 0 %
Lymphocytes Relative: 33 %
Lymphs Abs: 2 10*3/uL (ref 0.7–4.0)
MCH: 26.1 pg (ref 26.0–34.0)
MCHC: 30.9 g/dL (ref 30.0–36.0)
MCV: 84.6 fL (ref 80.0–100.0)
MONOS PCT: 7 %
Monocytes Absolute: 0.4 10*3/uL (ref 0.1–1.0)
NEUTROS ABS: 3.5 10*3/uL (ref 1.7–7.7)
NEUTROS PCT: 58 %
NRBC: 0 % (ref 0.0–0.2)
Platelets: 271 10*3/uL (ref 150–400)
RBC: 4.94 MIL/uL (ref 3.87–5.11)
RDW: 15 % (ref 11.5–15.5)
WBC: 6.1 10*3/uL (ref 4.0–10.5)

## 2018-07-23 LAB — LIPASE, BLOOD: LIPASE: 35 U/L (ref 11–51)

## 2018-07-23 MED ORDER — PANTOPRAZOLE SODIUM 40 MG PO TBEC: 40.0000 mg | DELAYED_RELEASE_TABLET | Freq: Every day | ORAL | 0 refills | Status: DC

## 2018-07-23 MED ORDER — FENTANYL CITRATE (PF) 100 MCG/2ML IJ SOLN
50.0000 ug | Freq: Once | INTRAMUSCULAR | Status: AC
  Administered 2018-07-23: 50 ug via INTRAVENOUS
  Filled 2018-07-23: qty 2

## 2018-07-23 MED ORDER — SUCRALFATE 1 GM/10ML PO SUSP
1.0000 g | Freq: Once | ORAL | Status: AC
  Administered 2018-07-23: 1 g via ORAL
  Filled 2018-07-23: qty 10

## 2018-07-23 MED ORDER — ONDANSETRON HCL 4 MG/2ML IJ SOLN
4.0000 mg | Freq: Once | INTRAMUSCULAR | Status: AC
  Administered 2018-07-23: 4 mg via INTRAVENOUS
  Filled 2018-07-23: qty 2

## 2018-07-23 MED ORDER — FAMOTIDINE 20 MG PO TABS: 20.0000 mg | ORAL_TABLET | Freq: Two times a day (BID) | ORAL | 0 refills | Status: DC

## 2018-07-23 NOTE — ED Triage Notes (Signed)
Patient reports she got up this am to get ready, she states her "stomach began to feel tight" and she felt sob. She also reports she felt like her bp was up and she had palpitations. Pt denies chest pain. resp e/u, nad.

## 2018-07-23 NOTE — Discharge Instructions (Addendum)
If you develop worsening or severe abdominal pain, vomiting or vomiting blood, bloody or black stools, or any other new/concerning symptoms and return to the ER for evaluation.

## 2018-07-23 NOTE — ED Provider Notes (Signed)
Care transferred to me.  The patient's right upper quadrant ultrasound is unremarkable.  This is probably gastritis.  She will be treated with Protonix and Pepcid.  She was offered GI referral but she prefers to follow-up with PCP and get referral from her.  Results for orders placed or performed during the hospital encounter of 07/23/18  CBC with Differential/Platelet  Result Value Ref Range   WBC 6.1 4.0 - 10.5 K/uL   RBC 4.94 3.87 - 5.11 MIL/uL   Hemoglobin 12.9 12.0 - 15.0 g/dL   HCT 16.141.8 09.636.0 - 04.546.0 %   MCV 84.6 80.0 - 100.0 fL   MCH 26.1 26.0 - 34.0 pg   MCHC 30.9 30.0 - 36.0 g/dL   RDW 40.915.0 81.111.5 - 91.415.5 %   Platelets 271 150 - 400 K/uL   nRBC 0.0 0.0 - 0.2 %   Neutrophils Relative % 58 %   Neutro Abs 3.5 1.7 - 7.7 K/uL   Lymphocytes Relative 33 %   Lymphs Abs 2.0 0.7 - 4.0 K/uL   Monocytes Relative 7 %   Monocytes Absolute 0.4 0.1 - 1.0 K/uL   Eosinophils Relative 1 %   Eosinophils Absolute 0.1 0.0 - 0.5 K/uL   Basophils Relative 1 %   Basophils Absolute 0.1 0.0 - 0.1 K/uL   Immature Granulocytes 0 %   Abs Immature Granulocytes 0.01 0.00 - 0.07 K/uL  Comprehensive metabolic panel  Result Value Ref Range   Sodium 137 135 - 145 mmol/L   Potassium 3.6 3.5 - 5.1 mmol/L   Chloride 102 98 - 111 mmol/L   CO2 23 22 - 32 mmol/L   Glucose, Bld 137 (H) 70 - 99 mg/dL   BUN 11 8 - 23 mg/dL   Creatinine, Ser 7.821.04 (H) 0.44 - 1.00 mg/dL   Calcium 9.1 8.9 - 95.610.3 mg/dL   Total Protein 7.3 6.5 - 8.1 g/dL   Albumin 3.9 3.5 - 5.0 g/dL   AST 39 15 - 41 U/L   ALT 44 0 - 44 U/L   Alkaline Phosphatase 76 38 - 126 U/L   Total Bilirubin 0.8 0.3 - 1.2 mg/dL   GFR calc non Af Amer 56 (L) >60 mL/min   GFR calc Af Amer >60 >60 mL/min   Anion gap 12 5 - 15  Lipase, blood  Result Value Ref Range   Lipase 35 11 - 51 U/L  I-stat troponin, ED  Result Value Ref Range   Troponin i, poc 0.00 0.00 - 0.08 ng/mL   Comment 3           [place] Abdomen Limited Ruq  Result Date: 07/23/2018 CLINICAL DATA:   Right upper quadrant/epigastric pain. EXAM: ULTRASOUND ABDOMEN LIMITED RIGHT UPPER QUADRANT COMPARISON:  CT abdomen and pelvis 04/29/2018 FINDINGS: Gallbladder: No gallstones or wall thickening visualized. No sonographic Murphy sign noted by sonographer. Common bile duct: Diameter: 4 mm Liver: No focal lesion identified. Within normal limits in parenchymal echogenicity. Portal vein is patent on color Doppler imaging with normal direction of blood flow towards the liver. IMPRESSION: Negative right upper quadrant ultrasound. Electronically Signed   By: Sebastian AcheAllen  Grady M.D.   On: 07/23/2018 09:02      Pricilla LovelessGoldston, Maddock Finigan, MD 07/23/18 [phone]

## 2018-07-23 NOTE — ED Notes (Signed)
MD aware of BP

## 2018-07-23 NOTE — ED Provider Notes (Signed)
[facility] EMERGENCY DEPARTMENT Provider Note   CSN: [id number] Arrival date & time: 07/23/18  4098     History   Chief Complaint Chief Complaint  Patient presents with  . Shortness of Breath  . Bradycardia    HPI Kazoua Gossen is a 67 y.o. female.  The history is provided by the patient.  Abdominal Pain   This is a new problem. The current episode started 1 to 2 hours ago. The problem occurs constantly. The problem has not changed since onset.The pain is located in the epigastric region. The pain is moderate. Associated symptoms include nausea. Pertinent negatives include fever, diarrhea, vomiting and constipation. The symptoms are aggravated by certain positions and palpation. Nothing relieves the symptoms.   Pt reports she woke up this morning feeling well, she went to the restroom and afterwards had a glass of water.  She suddenly had onset of upper abdominal pain that appears to radiate up into her throat.  No other chest pain.  She reports feeling mildly short of breath and dizzy due to anxiety.  No vomiting.  No hemoptysis. She has Never had this before. Past Medical History:  Diagnosis Date  . Diabetes mellitus without complication (HCC)    Type 2  . Graves disease   . Hypertension   . Thyroid disease     There are no active problems to display for this patient.   Past Surgical History:  Procedure Laterality Date  . VAGINAL HYSTERECTOMY       OB History   None      Home Medications    Prior to Admission medications   Medication Sig Start Date End Date Taking? Authorizing Provider  amLODipine (NORVASC) 10 MG tablet Take 5 mg by mouth at bedtime.     [provider]  aspirin EC 81 MG tablet Take 81 mg by mouth daily.    [provider]  busPIRone (BUSPAR) 10 MG tablet Take 5 mg by mouth 2 (two) times daily.    [provider]  carboxymethylcellulose (REFRESH PLUS) 0.5 % SOLN Place 1 drop into both eyes daily  as needed (dry eyes).     [provider]  carvedilol (COREG) 25 MG tablet Take 50 mg by mouth 2 (two) times daily.     [provider]  Cetirizine HCl (ZYRTEC PO) Take 1 tablet by mouth 2 (two) times daily as needed (allergies).     [provider]  gabapentin (NEURONTIN) 100 MG capsule Take 200 mg by mouth 2 (two) times daily.     [provider]  ibuprofen (ADVIL,MOTRIN) 200 MG tablet Take 200 mg by mouth every 6 (six) hours as needed for mild pain.    [provider]  levothyroxine (SYNTHROID, LEVOTHROID) 88 MCG tablet Take 1 tablet (88 mcg total) by mouth daily before breakfast. 07/21/17   Petrucelli, Samantha R, PA-C  lisinopril (PRINIVIL,ZESTRIL) 10 MG tablet Take 3 tablets (30 mg total) by mouth daily. Patient taking differently: Take 40 mg by mouth daily.  06/08/16 04/29/18  Alvira Monday, MD  Lurasidone HCl (LATUDA PO) Take 1 tablet by mouth at bedtime.    [provider]  MAGNESIUM PO Take 400 mg by mouth daily.     [provider]  metFORMIN (GLUCOPHAGE) 500 MG tablet Take 500 mg by mouth 2 (two) times daily.     [provider]  ondansetron (ZOFRAN ODT) 4 MG disintegrating tablet Take 1 tablet (4 mg total) by mouth every 8 (  eight) hours as needed for nausea or vomiting. 04/29/18   Terrilee Files, MD  potassium chloride SA (K-DUR,KLOR-CON) 20 MEQ tablet Take 20 mEq by mouth 2 (two) times daily.    [provider]  pravastatin (PRAVACHOL) 80 MG tablet Take 80 mg by mouth daily.     [provider]  sodium chloride (OCEAN) 0.65 % SOLN nasal spray Place 1 spray into both nostrils as needed for congestion. Flonase    [provider]  traZODone (DESYREL) 50 MG tablet Take 25 mg by mouth at bedtime as needed (sleep).     [provider]    Family History No family history on file.  Social History Social History   Tobacco Use  . Smoking status: Never Smoker  . Smokeless  tobacco: Never Used  Substance Use Topics  . Alcohol use: No  . Drug use: Not on file     Allergies   Keflex [cephalexin] and Sudafed [pseudoephedrine hcl]   Review of Systems Review of Systems  Constitutional: Negative for fever.  Respiratory: Positive for shortness of breath.   Gastrointestinal: Positive for abdominal pain and nausea. Negative for blood in stool, constipation, diarrhea and vomiting.  All other systems reviewed and are negative.    Physical Exam Updated Vital Signs BP (!) 172/68   Pulse (!) 38   Temp 98.9 F (37.2 C) (Oral)   Resp 20   SpO2 100%  HR - 70s Physical Exam CONSTITUTIONAL: Elderly, anxious HEAD: Normocephalic/atraumatic EYES: EOMI/PERRL, no icterus ENMT: Mucous membranes moist NECK: supple no meningeal signs SPINE/BACK:entire spine nontender CV: S1/S2 noted, no murmurs/rubs/gallops noted LUNGS: Lungs are clear to auscultation bilaterally, no apparent distress ABDOMEN: soft, moderate epigastric tenderness, no rebound or guarding, bowel sounds noted throughout abdomen GU:no cva tenderness NEURO: Pt is awake/alert/appropriate, moves all extremitiesx4.  No facial droop.   EXTREMITIES: pulses normal/equal, full ROM SKIN: warm, color normal PSYCH: anxious   ED Treatments / Results  Labs (all labs ordered are listed, but only abnormal results are displayed) Labs Reviewed  COMPREHENSIVE METABOLIC PANEL - Abnormal; Notable for the following components:      Result Value   Glucose, Bld 137 (*)    Creatinine, Ser 1.04 (*)    GFR calc non Af Amer 56 (*)    All other components within normal limits  CBC WITH DIFFERENTIAL/PLATELET  LIPASE, BLOOD  I-STAT TROPONIN, ED    EKG EKG Interpretation  Date/Time:  Tuesday July 23 2018 06:18:36 EST Ventricular Rate:  74 PR Interval:  166 QRS Duration: 88 QT Interval:  442 QTC Calculation: 490 R Axis:   23 Text Interpretation:  Sinus rhythm with frequent Premature ventricular complexes in  a pattern of bigeminy Prolonged QT Abnormal ECG Confirmed by Zadie Rhine ([id number]) on 07/23/2018 5:55:30 AM   Radiology No results found.  Procedures Procedures    Medications Ordered in ED Medications  sucralfate (CARAFATE) 1 GM/10ML suspension 1 g (1 g Oral Given 07/23/18 0645)  ondansetron (ZOFRAN) injection 4 mg (4 mg Intravenous Given 07/23/18 0645)  fentaNYL (SUBLIMAZE) injection 50 mcg (50 mcg Intravenous Given 07/23/18 0730)     Initial Impression / Assessment and Plan / ED Course  I have reviewed the triage vital signs and the nursing notes.  Pertinent labs  results that were available during my care of the patient were reviewed by me and considered in my medical decision making (see chart for details).     7:30 AM Patient presented for onset of epigastric abdominal  pain.  She is focally tender in epigastric region.  Labs are reassuring, but she still has pain.  We will proceed with ultrasound imaging.  She had a CT abdomen/pelvis earlier this year, that was negative.  She denies any active chest pain this time, I have low suspicion for ACS.  Her EKG shows bigeminy which has been seen in previous EKGs.  Her actual heart rate is in the 70s  While I was in the room, patient was already speaking to her PCP by phone. I spoke to her PCP by phone (pt gave permission) and she will gladly see the patient tomorrow  Signed out to Dr Criss AlvineGoldston at shift change  Final Clinical Impressions(s) / ED Diagnoses   Final diagnoses:  RUQ pain  Bigeminy    ED Discharge Orders    None       Zadie RhineWickline, Marcellis Frampton, MD 07/23/18 [id number]

## 2018-07-23 NOTE — ED Notes (Signed)
Provider OK with discharge vitals, instructed to take meds at home

## 2018-07-23 NOTE — ED Notes (Signed)
Patient ambulated to bathroom with minimal assistance.  

## 2018-07-26 LAB — I-STAT TROPONIN, ED: TROPONIN I, POC: 0 ng/mL (ref 0.00–0.08)

## 2018-12-04 ENCOUNTER — Emergency Department (HOSPITAL_COMMUNITY)
Admission: EM | Admit: 2018-12-04 | Discharge: 2018-12-04 | Disposition: A | Discharge status: Home / Self Care | Payer: No Typology Code available for payment source | Attending: Emergency Medicine | Admitting: Emergency Medicine

## 2018-12-04 ENCOUNTER — Other Ambulatory Visit: Payer: Self-pay

## 2018-12-04 ENCOUNTER — Ambulatory Visit (HOSPITAL_COMMUNITY)
Admission: EM | Admit: 2018-12-04 | Discharge: 2018-12-04 | Disposition: A | Discharge status: Home / Self Care | Payer: Medicare Other | Attending: Family Medicine | Admitting: Family Medicine

## 2018-12-04 ENCOUNTER — Encounter (HOSPITAL_COMMUNITY): Payer: Self-pay | Admitting: Emergency Medicine

## 2018-12-04 DIAGNOSIS — R197 Diarrhea, unspecified: Secondary | ICD-10-CM | POA: Diagnosis not present

## 2018-12-04 DIAGNOSIS — Z7982 Long term (current) use of aspirin: Secondary | ICD-10-CM | POA: Insufficient documentation

## 2018-12-04 DIAGNOSIS — I160 Hypertensive urgency: Secondary | ICD-10-CM

## 2018-12-04 DIAGNOSIS — Z7984 Long term (current) use of oral hypoglycemic drugs: Secondary | ICD-10-CM | POA: Diagnosis not present

## 2018-12-04 DIAGNOSIS — Z79899 Other long term (current) drug therapy: Secondary | ICD-10-CM | POA: Insufficient documentation

## 2018-12-04 DIAGNOSIS — I10 Essential (primary) hypertension: Secondary | ICD-10-CM | POA: Diagnosis present

## 2018-12-04 DIAGNOSIS — E119 Type 2 diabetes mellitus without complications: Secondary | ICD-10-CM | POA: Diagnosis not present

## 2018-12-04 LAB — URINALYSIS, ROUTINE W REFLEX MICROSCOPIC
Bilirubin Urine: NEGATIVE
Glucose, UA: NEGATIVE mg/dL
Hgb urine dipstick: NEGATIVE
Ketones, ur: NEGATIVE mg/dL
Leukocytes,Ua: NEGATIVE
Nitrite: NEGATIVE
Protein, ur: NEGATIVE mg/dL
Specific Gravity, Urine: 1.008 (ref 1.005–1.030)
pH: 8 (ref 5.0–8.0)

## 2018-12-04 LAB — CBC WITH DIFFERENTIAL/PLATELET
Abs Immature Granulocytes: 0.01 10*3/uL (ref 0.00–0.07)
Basophils Absolute: 0 10*3/uL (ref 0.0–0.1)
Basophils Relative: 1 %
Eosinophils Absolute: 0.1 10*3/uL (ref 0.0–0.5)
Eosinophils Relative: 1 %
HCT: 40.1 % (ref 36.0–46.0)
Hemoglobin: 12.9 g/dL (ref 12.0–15.0)
Immature Granulocytes: 0 %
Lymphocytes Relative: 23 %
Lymphs Abs: 1.3 10*3/uL (ref 0.7–4.0)
MCH: 26.7 pg (ref 26.0–34.0)
MCHC: 32.2 g/dL (ref 30.0–36.0)
MCV: 83 fL (ref 80.0–100.0)
Monocytes Absolute: 0.4 10*3/uL (ref 0.1–1.0)
Monocytes Relative: 7 %
Neutro Abs: 3.9 10*3/uL (ref 1.7–7.7)
Neutrophils Relative %: 68 %
Platelets: 236 10*3/uL (ref 150–400)
RBC: 4.83 MIL/uL (ref 3.87–5.11)
RDW: 13.9 % (ref 11.5–15.5)
WBC: 5.7 10*3/uL (ref 4.0–10.5)
nRBC: 0 % (ref 0.0–0.2)

## 2018-12-04 LAB — BASIC METABOLIC PANEL
Anion gap: 10 (ref 5–15)
BUN: 12 mg/dL (ref 8–23)
CO2: 27 mmol/L (ref 22–32)
Calcium: 9.5 mg/dL (ref 8.9–10.3)
Chloride: 103 mmol/L (ref 98–111)
Creatinine, Ser: 1.06 mg/dL — ABNORMAL HIGH (ref 0.44–1.00)
GFR calc Af Amer: >60 mL/min (ref >60)
GFR calc non Af Amer: 55 mL/min — ABNORMAL LOW (ref >60)
Glucose, Bld: 134 mg/dL — ABNORMAL HIGH (ref 70–99)
Potassium: 3.9 mmol/L (ref 3.5–5.1)
Sodium: 140 mmol/L (ref 135–145)

## 2018-12-04 NOTE — ED Provider Notes (Signed)
[facility] EMERGENCY DEPARTMENT Provider Note   CSN: [id number] Arrival date & time: 12/04/18  1641    History   Chief Complaint Chief Complaint  Patient presents with  . Hypertension    HPI Stacy Nash is a 68 y.o. female.     HPI Presents with concern of hypertension, nausea, diarrhea. Patient has a history of thyroid disorder, hypertension, and diabetes She states that she was, however, in her usual state of health until a few hours ago. Not long after eating an unusual meal for her she had an episode of loose stool. Subsequently she had onset of feeling of generalized discomfort. She noticed that her blood pressure was elevated, and with concern for this she went to urgent care. There she was found to have a blood pressure elevated with systolic greater than 200. She was sent here for evaluation. Currently she denies abdominal pain, chest pain, dyspnea, confusion, disorientation. No ongoing signs of need to defecate, nor nausea. However, she does feel generally abnormal compared to baseline.  Patient takes multiple medication for blood pressure control. Patient is also currently taking antibiotics for recent dental infection.  Past Medical History:  Diagnosis Date  . Diabetes mellitus without complication (HCC)    Type 2  . Graves disease   . Hypertension   . Thyroid disease     There are no active problems to display for this patient.   Past Surgical History:  Procedure Laterality Date  . VAGINAL HYSTERECTOMY       OB History   No obstetric history on file.      Home Medications    Prior to Admission medications   Medication Sig Start Date End Date Taking? Authorizing Provider  acetaminophen (TYLENOL) 500 MG tablet Take 500-1,000 mg by mouth every 6 (six) hours as needed for mild pain or headache.   Yes [provider]  amLODipine (NORVASC) 10 MG tablet Take 10 mg by mouth at bedtime.    Yes [provider]   amoxicillin (AMOXIL) 500 MG tablet Take 500 mg by mouth as directed.   Yes [provider]  aspirin EC 81 MG tablet Take 81 mg by mouth daily.   Yes [provider]  atorvastatin (LIPITOR) 40 MG tablet Take 40 mg by mouth every other day. AT BEDTIME   Yes [provider]  busPIRone (BUSPAR) 10 MG tablet Take 10 mg by mouth every morning.    Yes [provider]  carboxymethylcellulose (REFRESH PLUS) 0.5 % SOLN Place 1 drop into both eyes daily as needed (dry eyes).    Yes [provider]  carvedilol (COREG) 25 MG tablet Take 25 mg by mouth 2 (two) times daily.    Yes [provider]  cetirizine (ZYRTEC) 10 MG tablet Take 10 mg by mouth daily as needed for allergies or rhinitis.   Yes [provider]  chlorhexidine (PERIDEX) 0.12 % solution Use as directed 15 mLs in the mouth or throat 2 (two) times daily.   Yes [provider]  gabapentin (NEURONTIN) 100 MG capsule Take 200 mg by mouth 2 (two) times daily as needed (for Restless Legs Syndrome).    Yes [provider]  hydrochlorothiazide (HYDRODIURIL) 50 MG tablet Take 50 mg by mouth daily.   Yes [provider]  levothyroxine (SYNTHROID, LEVOTHROID) 88 MCG tablet Take 1 tablet (88 mcg total) by mouth daily before breakfast. 07/21/17  Yes Petrucelli, Samantha R, PA-C  losartan (COZAAR) 100 MG tablet Take 100  mg by mouth daily.   Yes [provider]  MAGNESIUM PO Take 1 tablet by mouth daily.    Yes [provider]  metFORMIN (GLUCOPHAGE) 500 MG tablet Take 500 mg by mouth See admin instructions. Take 500 mg by mouth in the morning before breakfast and an additional 500 mg at bedtime if needed for elevated BGL   Yes [provider]  potassium chloride 20 MEQ/15ML (10%) SOLN Take 40 mEq by mouth daily.   Yes [provider]  traZODone (DESYREL) 50 MG tablet Take 25 mg by mouth at bedtime as needed (sleep).    Yes [provider]  famotidine (PEPCID) 20 MG tablet Take 1 tablet (20 mg total) by mouth 2 (two) times daily. Patient not taking: Reported on 12/04/2018 07/23/18   Pricilla LovelessGoldston, Scott, MD  ondansetron (ZOFRAN ODT) 4 MG disintegrating tablet Take 1 tablet (4 mg total) by mouth every 8 (eight) hours as needed for nausea or vomiting. Patient not taking: Reported on 12/04/2018 04/29/18   Terrilee FilesButler, Michael C, MD  pantoprazole (PROTONIX) 40 MG tablet Take 1 tablet (40 mg total) by mouth daily. Patient not taking: Reported on 12/04/2018 07/23/18   Pricilla LovelessGoldston, Scott, MD  potassium chloride SA (K-DUR,KLOR-CON) 20 MEQ tablet Take 20 mEq by mouth 2 (two) times daily.    [provider]    Family History No family history on file.  Social History Social History   Tobacco Use  . Smoking status: Never Smoker  . Smokeless tobacco: Never Used  Substance Use Topics  . Alcohol use: No  . Drug use: Not on file     Allergies   Keflex [cephalexin] and Sudafed [pseudoephedrine hcl]   Review of Systems Review of Systems  Constitutional:       Per HPI, otherwise negative  HENT:       Per HPI, otherwise negative  Respiratory:       Per HPI, otherwise negative  Cardiovascular:       Per HPI, otherwise negative  Gastrointestinal: Positive for diarrhea. Negative for vomiting.  Endocrine:       Negative aside from HPI  Genitourinary:       Neg aside from HPI   Musculoskeletal:       Per HPI, otherwise negative  Skin: Negative.   Neurological: Negative for syncope.     Physical Exam Updated Vital Signs BP (!) 173/70   Pulse 63   Temp 97.9 F (36.6 C) (Oral)   Resp 17   Ht 5\' 5"  (1.651 m)   Wt 74.8 kg   SpO2 100%   BMI 27.46 kg/m   Physical Exam Vitals signs and nursing note reviewed.  Constitutional:      General: She is not in acute distress.    Appearance: She is well-developed.  HENT:     Head: Normocephalic and atraumatic.  Eyes:     Conjunctiva/sclera: Conjunctivae normal.   Cardiovascular:     Rate and Rhythm: Normal rate and regular rhythm.  Pulmonary:     Effort: Pulmonary effort is normal. No respiratory distress.     Breath sounds: Normal breath sounds. No stridor.  Abdominal:     General: There is no distension.  Skin:    General: Skin is warm and dry.  Neurological:     Mental Status: She is alert and oriented to person, place, and time.     Cranial Nerves: No cranial nerve deficit.      ED Treatments / Results  Labs (  all labs ordered are listed, but only abnormal results are displayed) Labs Reviewed  BASIC METABOLIC PANEL - Abnormal; Notable for the following components:      Result Value   Glucose, Bld 134 (*)    Creatinine, Ser 1.06 (*)    GFR calc non Af Amer 55 (*)    All other components within normal limits  URINALYSIS, ROUTINE W REFLEX MICROSCOPIC - Abnormal; Notable for the following components:   Color, Urine STRAW (*)    All other components within normal limits  CBC WITH DIFFERENTIAL/PLATELET    EKG None  Radiology No results found.  Procedures Procedures (including critical care time)  Medications Ordered in ED Medications - No data to display   Initial Impression / Assessment and Plan / ED Course  I have reviewed the triage vital signs and the nursing notes.  Pertinent labs & imaging results that were available during my care of the patient were reviewed by me and considered in my medical decision making (see chart for details).        7:23 PM  Patient in no distress, awake, alert. Reviewed blood pressure substantially better, now abnormally high, but not critically so, no evidence for endorgan effects, no distress, no ongoing complaints per With a lengthy conversation about her hypertensive urgency. Some suspicion for medication interactions given recent use of amoxicillin versus stress reaction with her episode of nausea, diarrhea. Now, with no ongoing complaints, improved blood pressure, she was  discharged with explicit instructions to follow-up with primary care for consideration of her medication regimen.   Final Clinical Impressions(s) / ED Diagnoses   Final diagnoses:  Hypertensive urgency     Gerhard Munch, MD 12/04/18 1924

## 2018-12-04 NOTE — Discharge Instructions (Addendum)
As discussed, your evaluation today has been largely reassuring.  But, it is important that you monitor your condition carefully, and do not hesitate to return to the ED if you develop new, or concerning changes in your condition. ? ?Otherwise, please follow-up with your physician for appropriate ongoing care. ? ?

## 2018-12-04 NOTE — ED Triage Notes (Signed)
Pt sts htn and diarrhea today; pt sts taking her htn meds

## 2018-12-04 NOTE — ED Triage Notes (Signed)
Pt c/o high blood pressure ; pt states she did take her BP meds today ; was sent over here from urgent care to further evaluate BP was 219/99 at urgent care; pt denies any chest pain/sob/ headache/blurred vision

## 2018-12-04 NOTE — ED Provider Notes (Signed)
[facility]   [id number] 12/04/18 Arrival Time: 1517   CC: HTN; diarrhea  SUBJECTIVE:  Stacy Sizerarline Fluharty is a 68 y.o. female hx of DM, graves disease, HTN, and thyroid disease, who presents for elevated blood pressure. Checked blood pressure at home earlier today and was 204/109.  216/114 in office.  Took HCTZ and losartan this morning.  States she takes HCTZ 50 mg daily, and losartan 100 mg daily.  Also has norvasc 10 mg at bedtime, and coreg 25 mg twice daily, listed in her medications.  Complains of left sided shoulder pain, that she attributes to arthritis.  Denies HA, vision changes, dizziness, lightheadedness, chest pain, shortness of breath, numbness or tingling in extremities, abdominal pain, changes in bladder habits.    Patient also mentions 3 episodes of watery diarrhea that occurred after eating BBQ and potato salad this morning.  Symptoms have improved.  Currently on amoxicillin following root canal.  Also mentions recent treatment for H. Pylori 3 weeks ago.  Denies nausea, vomiting, and abdominal pain.    ROS: As per HPI. Past Medical History:  Diagnosis Date  . Diabetes mellitus without complication (HCC)    Type 2  . Graves disease   . Hypertension   . Thyroid disease    Past Surgical History:  Procedure Laterality Date  . VAGINAL HYSTERECTOMY     Allergies  Allergen Reactions  . Keflex [Cephalexin] Palpitations  . Sudafed [Pseudoephedrine Hcl] Palpitations   No current facility-administered medications on file prior to encounter.    Current Outpatient Medications on File Prior to Encounter  Medication Sig Dispense Refill  . amLODipine (NORVASC) 10 MG tablet Take 10 mg by mouth at bedtime.     Marland Kitchen. aspirin EC 81 MG tablet Take 81 mg by mouth daily.    Marland Kitchen. atorvastatin (LIPITOR) 40 MG tablet Take 40 mg by mouth at bedtime.    . busPIRone (BUSPAR) 10 MG tablet Take 5 mg by mouth 2 (two) times daily.    . carboxymethylcellulose (REFRESH PLUS) 0.5 % SOLN Place 1  drop into both eyes daily as needed (dry eyes).     . carvedilol (COREG) 25 MG tablet Take 50 mg by mouth 2 (two) times daily.     . Cetirizine HCl (ZYRTEC PO) Take 1 tablet by mouth 2 (two) times daily as needed (allergies).     . famotidine (PEPCID) 20 MG tablet Take 1 tablet (20 mg total) by mouth 2 (two) times daily. 30 tablet 0  . gabapentin (NEURONTIN) 100 MG capsule Take 200 mg by mouth 2 (two) times daily.     . hydrochlorothiazide (HYDRODIURIL) 50 MG tablet Take 50 mg by mouth daily.    Marland Kitchen. ibuprofen (ADVIL,MOTRIN) 200 MG tablet Take 200 mg by mouth every 6 (six) hours as needed for mild pain.    Marland Kitchen. levothyroxine (SYNTHROID, LEVOTHROID) 88 MCG tablet Take 1 tablet (88 mcg total) by mouth daily before breakfast. (Patient taking differently: Take 112 mcg by mouth daily before breakfast. ) 30 tablet 0  . losartan (COZAAR) 100 MG tablet Take 100 mg by mouth daily.    Marland Kitchen. lurasidone (LATUDA) 20 MG TABS tablet Take 20 mg by mouth at bedtime.    Marland Kitchen. MAGNESIUM PO Take 800 mg by mouth daily.     . metFORMIN (GLUCOPHAGE) 500 MG tablet Take 500 mg by mouth 2 (two) times daily.     . ondansetron (ZOFRAN ODT) 4 MG disintegrating tablet Take 1 tablet (4 mg total) by mouth every 8 (  eight) hours as needed for nausea or vomiting. 15 tablet 0  . pantoprazole (PROTONIX) 40 MG tablet Take 1 tablet (40 mg total) by mouth daily. 30 tablet 0  . potassium chloride SA (K-DUR,KLOR-CON) 20 MEQ tablet Take 20 mEq by mouth 2 (two) times daily.    . sodium chloride (OCEAN) 0.65 % SOLN nasal spray Place 1 spray into both nostrils as needed for congestion. Flonase    . traZODone (DESYREL) 50 MG tablet Take 25 mg by mouth at bedtime as needed (sleep).      Social History   Socioeconomic History  . Marital status: Single    Spouse name: Not on file  . Number of children: Not on file  . Years of education: Not on file  . Highest education level: Not on file  Occupational History  . Not on file  Social Needs  . Financial  resource strain: Not on file  . Food insecurity:    Worry: Not on file    Inability: Not on file  . Transportation needs:    Medical: Not on file    Non-medical: Not on file  Tobacco Use  . Smoking status: Never Smoker  . Smokeless tobacco: Never Used  Substance and Sexual Activity  . Alcohol use: No  . Drug use: Not on file  . Sexual activity: Never  Lifestyle  . Physical activity:    Days per week: Not on file    Minutes per session: Not on file  . Stress: Not on file  Relationships  . Social connections:    Talks on phone: Not on file    Gets together: Not on file    Attends religious service: Not on file    Active member of club or organization: Not on file    Attends meetings of clubs or organizations: Not on file    Relationship status: Not on file  . Intimate partner violence:    Fear of current or ex partner: Not on file    Emotionally abused: Not on file    Physically abused: Not on file    Forced sexual activity: Not on file  Other Topics Concern  . Not on file  Social History Narrative  . Not on file   History reviewed. No pertinent family history.   OBJECTIVE:  Vitals:   12/04/18 1529  BP: (!) 216/114  Pulse: 70  Resp: 18  Temp: 97.7 F (36.5 C)  TempSrc: Oral  SpO2: 100%   Rechecked BP: 219/99   General appearance: alert; no distress Eyes: PERRLA; EOMI; conjunctiva normal HENT: normocephalic; atraumatic; oropharynx clear Neck: supple Lungs: clear to auscultation bilaterally without adventitious breath sounds Heart: soft systolic murmur with click appreciated over aortic region Chest Wall: No heaves, lifts, or thrills Abdomen: soft, non-tender; bowel sounds normal; no guarding Extremities: no cyanosis or edema; symmetrical with no gross deformities; no calf tenderness Skin: warm and dry Psychological: alert and cooperative; normal mood and affect  ECG: Orders placed or performed during the hospital encounter of 12/04/18  . ED EKG  . ED  EKG    EKG normal sinus rhythm without ST elevations, or depressions with occasional PVCs.  Prolonged QT stable compared to past EKG.  Past EKG reviewed from 07/23/18.     ASSESSMENT & PLAN:  1. Hypertensive urgency   2. Diarrhea, unspecified type     Discussed patient case with Dr. Tracie Harrier.  Recommended further evaluation in the ED given elevated blood pressure reading in office.  Recommending further evaluation and management in the ED for hypertensive urgency, unable to rule out hypertensive emergency in UC setting.  EKG stable compared to last EKG done in December of 2019.  Patient aware and in agreement with this plan.  Will go by private vehicle to [facility].     Rennis Harding, PA-C 12/04/18 1713

## 2018-12-04 NOTE — ED Notes (Signed)
Pt ambulated to the bathroom with steady gait.

## 2018-12-04 NOTE — Discharge Instructions (Addendum)
Recommending further evaluation and management in the ED for hypertensive urgency, unable to rule out hypertensive emergency in UC setting.  EKG stable compared to last EKG done in December of 2019.  Patient aware and in agreement with this plan.  Will go by private vehicle to [facility].

## 2018-12-17 ENCOUNTER — Other Ambulatory Visit: Payer: Self-pay

## 2018-12-17 ENCOUNTER — Encounter (HOSPITAL_COMMUNITY): Payer: Self-pay

## 2018-12-17 ENCOUNTER — Ambulatory Visit (HOSPITAL_COMMUNITY)
Admission: EM | Admit: 2018-12-17 | Discharge: 2018-12-17 | Disposition: A | Discharge status: Home / Self Care | Payer: Medicare Other | Attending: Physician Assistant | Admitting: Physician Assistant

## 2018-12-17 DIAGNOSIS — T23262A Burn of second degree of back of left hand, initial encounter: Secondary | ICD-10-CM

## 2018-12-17 MED ORDER — MUPIROCIN CALCIUM 2 % EX CREA: TOPICAL_CREAM | CUTANEOUS | 0 refills | Status: DC

## 2018-12-17 MED ORDER — BACITRACIN ZINC 500 UNIT/GM EX OINT
TOPICAL_OINTMENT | CUTANEOUS | Status: AC
  Filled 2018-12-17: qty 0.9

## 2018-12-17 NOTE — ED Provider Notes (Signed)
[facility]    CSN: [id number] Arrival date & time: 12/17/18  1135     History   Chief Complaint Chief Complaint  Patient presents with  . Hand Pain    HPI Stacy Nash is a 68 y.o. female.   The history is provided by the patient. No language interpreter was used.  Hand Pain  This is a new problem. Episode onset: a moth ago. The problem occurs constantly. The problem has been gradually worsening. Nothing aggravates the symptoms. Nothing relieves the symptoms. She has tried nothing for the symptoms. The treatment provided no relief.  Pt reports she burned her hand a month ago. Pt reports burn will not heal  Past Medical History:  Diagnosis Date  . Diabetes mellitus without complication (HCC)    Type 2  . Graves disease   . Hypertension   . Thyroid disease     There are no active problems to display for this patient.   Past Surgical History:  Procedure Laterality Date  . VAGINAL HYSTERECTOMY      OB History   No obstetric history on file.      Home Medications    Prior to Admission medications   Medication Sig Start Date End Date Taking? Authorizing Provider  amoxicillin (AMOXIL) 500 MG tablet Take 500 mg by mouth as directed.   Yes [provider]  atorvastatin (LIPITOR) 40 MG tablet Take 40 mg by mouth every other day. AT BEDTIME   Yes [provider]  busPIRone (BUSPAR) 10 MG tablet Take 10 mg by mouth every morning.    Yes [provider]  carboxymethylcellulose (REFRESH PLUS) 0.5 % SOLN Place 1 drop into both eyes daily as needed (dry eyes).    Yes [provider]  levothyroxine (SYNTHROID, LEVOTHROID) 88 MCG tablet Take 1 tablet (88 mcg total) by mouth daily before breakfast. 07/21/17  Yes Petrucelli, Samantha R, PA-C  losartan (COZAAR) 100 MG tablet Take 100 mg by mouth daily.   Yes [provider]  MAGNESIUM PO Take 1 tablet by mouth daily.    Yes [provider]  acetaminophen (TYLENOL)  500 MG tablet Take 500-1,000 mg by mouth every 6 (six) hours as needed for mild pain or headache.    [provider]  amLODipine (NORVASC) 10 MG tablet Take 10 mg by mouth at bedtime.     [provider]  aspirin EC 81 MG tablet Take 81 mg by mouth daily.    [provider]  carvedilol (COREG) 25 MG tablet Take 25 mg by mouth 2 (two) times daily.     [provider]  chlorhexidine (PERIDEX) 0.12 % solution Use as directed 15 mLs in the mouth or throat 2 (two) times daily.    [provider]  famotidine (PEPCID) 20 MG tablet Take 1 tablet (20 mg total) by mouth 2 (two) times daily. Patient not taking: Reported on 12/04/2018 07/23/18   Pricilla Loveless, MD  gabapentin (NEURONTIN) 100 MG capsule Take 200 mg by mouth 2 (two) times daily as needed (for Restless Legs Syndrome).     [provider]  hydrochlorothiazide (HYDRODIURIL) 50 MG tablet Take 50 mg by mouth daily.    [provider]  metFORMIN (GLUCOPHAGE) 500 MG tablet Take 500 mg by mouth See admin instructions. Take 500 mg by mouth in the morning before breakfast and an additional 500 mg at bedtime if needed for elevated BGL    [provider]  mupirocin cream (BACTROBAN) 2 %  Apply to affected area 3 times daily 12/17/18 12/17/19  Cheron Schaumann K, PA-C  ondansetron (ZOFRAN ODT) 4 MG disintegrating tablet Take 1 tablet (4 mg total) by mouth every 8 (eight) hours as needed for nausea or vomiting. Patient not taking: Reported on 12/04/2018 04/29/18   Terrilee Files, MD  pantoprazole (PROTONIX) 40 MG tablet Take 1 tablet (40 mg total) by mouth daily. Patient not taking: Reported on 12/04/2018 07/23/18   Pricilla Loveless, MD  potassium chloride 20 MEQ/15ML (10%) SOLN Take 40 mEq by mouth 2 (two) times daily.     [provider]  traZODone (DESYREL) 50 MG tablet Take 25 mg by mouth at bedtime as needed (sleep).     [provider]    Family History History reviewed.  No pertinent family history.  Social History Social History   Tobacco Use  . Smoking status: Never Smoker  . Smokeless tobacco: Never Used  Substance Use Topics  . Alcohol use: No  . Drug use: Not on file     Allergies   Keflex [cephalexin] and Sudafed [pseudoephedrine hcl]   Review of Systems Review of Systems  Skin: Positive for wound.  All other systems reviewed and are negative.    Physical Exam Triage Vital Signs ED Triage Vitals [12/17/18 1211]  Enc Vitals Group     BP      Pulse      Resp      Temp      Temp src      SpO2      Weight      Height      Head Circumference      Peak Flow      Pain Score 0     Pain Loc      Pain Edu?      Excl. in GC?    No data found.  Updated Vital Signs There were no vitals taken for this visit.  Visual Acuity Right Eye Distance:   Left Eye Distance:   Bilateral Distance:    Right Eye Near:   Left Eye Near:    Bilateral Near:     Physical Exam Vitals signs and nursing note reviewed.  Constitutional:      Appearance: She is well-developed.  HENT:     Head: Normocephalic.  Neck:     Musculoskeletal: Normal range of motion.  Pulmonary:     Effort: Pulmonary effort is normal.  Abdominal:     General: There is no distension.  Musculoskeletal: Normal range of motion.  Skin:    Comments: 2cm wound red around edges scabbed  Neurological:     Mental Status: She is alert and oriented to person, place, and time.      UC Treatments / Results  Labs (all labs ordered are listed, but only abnormal results are displayed) Labs Reviewed - No data to display  EKG None  Radiology No results found.  Procedures Procedures (including critical care time)  Medications Ordered in UC Medications - No data to display  Initial Impression / Assessment and Plan / UC Course  I have reviewed the triage vital signs and the nursing notes.  Pertinent labs & imaging results that were available during my care of the  patient were reviewed by me and considered in my medical decision making (see chart for details).     Pt is on oral antibiotics.  I will treat with topical bactroban.  Final Clinical Impressions(s) / UC Diagnoses   Final  diagnoses:  Partial thickness burn of back of left hand, initial encounter     Discharge Instructions     Return if any problems   ED Prescriptions    Medication Sig Dispense Auth. Provider   mupirocin cream (BACTROBAN) 2 % Apply to affected area 3 times daily 30 g Elson AreasSofia,  K, [place]-C     Controlled Substance Prescriptions [place] Controlled Substance Registry consulted? Not Applicable  An After Visit Summary was printed and given to the patient.    Elson AreasSofia,  K, [place]C 12/17/18 1230

## 2018-12-17 NOTE — ED Triage Notes (Signed)
Pt states that she burned her hand last month, the are on hand has not healed and has enlarged, denies pain but itching, area is red

## 2018-12-17 NOTE — Discharge Instructions (Signed)
Return if any problems.

## 2018-12-29 ENCOUNTER — Encounter (HOSPITAL_COMMUNITY): Payer: Self-pay

## 2018-12-29 ENCOUNTER — Other Ambulatory Visit: Payer: Self-pay

## 2018-12-29 ENCOUNTER — Emergency Department (HOSPITAL_COMMUNITY)
Admission: EM | Admit: 2018-12-29 | Discharge: 2018-12-29 | Disposition: A | Discharge status: Home / Self Care | Payer: Medicare Other | Attending: Emergency Medicine | Admitting: Emergency Medicine

## 2018-12-29 DIAGNOSIS — I493 Ventricular premature depolarization: Secondary | ICD-10-CM | POA: Diagnosis not present

## 2018-12-29 DIAGNOSIS — Z7982 Long term (current) use of aspirin: Secondary | ICD-10-CM | POA: Diagnosis not present

## 2018-12-29 DIAGNOSIS — R42 Dizziness and giddiness: Secondary | ICD-10-CM

## 2018-12-29 DIAGNOSIS — Z79899 Other long term (current) drug therapy: Secondary | ICD-10-CM | POA: Insufficient documentation

## 2018-12-29 DIAGNOSIS — I10 Essential (primary) hypertension: Secondary | ICD-10-CM | POA: Diagnosis not present

## 2018-12-29 DIAGNOSIS — E119 Type 2 diabetes mellitus without complications: Secondary | ICD-10-CM | POA: Diagnosis not present

## 2018-12-29 LAB — CBC WITH DIFFERENTIAL/PLATELET
Abs Immature Granulocytes: 0.02 10*3/uL (ref 0.00–0.07)
Basophils Absolute: 0.1 10*3/uL (ref 0.0–0.1)
Basophils Relative: 1 %
Eosinophils Absolute: 0.1 10*3/uL (ref 0.0–0.5)
Eosinophils Relative: 2 %
HCT: 37.5 % (ref 36.0–46.0)
Hemoglobin: 11.9 g/dL — ABNORMAL LOW (ref 12.0–15.0)
Immature Granulocytes: 0 %
Lymphocytes Relative: 35 %
Lymphs Abs: 1.8 10*3/uL (ref 0.7–4.0)
MCH: 26.4 pg (ref 26.0–34.0)
MCHC: 31.7 g/dL (ref 30.0–36.0)
MCV: 83.1 fL (ref 80.0–100.0)
Monocytes Absolute: 0.3 10*3/uL (ref 0.1–1.0)
Monocytes Relative: 7 %
Neutro Abs: 2.7 10*3/uL (ref 1.7–7.7)
Neutrophils Relative %: 55 %
Platelets: 223 10*3/uL (ref 150–400)
RBC: 4.51 MIL/uL (ref 3.87–5.11)
RDW: 14.4 % (ref 11.5–15.5)
WBC: 5 10*3/uL (ref 4.0–10.5)
nRBC: 0 % (ref 0.0–0.2)

## 2018-12-29 LAB — BASIC METABOLIC PANEL
Anion gap: 10 (ref 5–15)
BUN: 10 mg/dL (ref 8–23)
CO2: 26 mmol/L (ref 22–32)
Calcium: 8.9 mg/dL (ref 8.9–10.3)
Chloride: 101 mmol/L (ref 98–111)
Creatinine, Ser: 0.99 mg/dL (ref 0.44–1.00)
GFR calc Af Amer: >60 mL/min (ref >60)
GFR calc non Af Amer: 59 mL/min — ABNORMAL LOW (ref >60)
Glucose, Bld: 205 mg/dL — ABNORMAL HIGH (ref 70–99)
Potassium: 3.7 mmol/L (ref 3.5–5.1)
Sodium: 137 mmol/L (ref 135–145)

## 2018-12-29 NOTE — Discharge Instructions (Addendum)
You were seen in the ER for palpitations and possible fainting.  Cardiac monitoring here revealed that you had episodes of extra beats called PVCs.  These certainly could make you feel like you are having palpitations.  Fortunately these are benign beats and did not lead to any complications.  Rest of the monitoring and blood work-up has been normal.  We recommend that you follow-up with your primary care doctor for further work-up for your symptoms.

## 2018-12-29 NOTE — ED Triage Notes (Signed)
Pt from home w/ a c/o dizziness and hypertension. Reports having a BP of 167/94 this morning and took her Clonidine. She began feeling dizzy at 1700 today. After this episode she "fell asleep" but believes she may have lost consciousness. When she woke up she felt her pulse beating in her neck. Her BP was 224/101 at 1920.

## 2018-12-29 NOTE — ED Provider Notes (Signed)
[facility]   CSN: [id number] Arrival date & time: 12/29/18  1937    History   Chief Complaint Chief Complaint  Patient presents with  . Hypertension  . Dizziness    HPI Stacy Sizerarline Siddoway is a 68 y.o. female.     HPI  68 year old female with history of diabetes, hypertension, Graves' disease comes in a chief complaint of elevated blood pressure and dizziness. Patient states that this afternoon she was feeling significantly weak and laid down on her couch.  2 or 3 hours later she woke up.  She is unsure if she fell asleep or she fainted.  She denies any chest pain, shortness of breath, headaches, neck pain, nausea or diaphoresis.  She does indicate however that she had some palpitations and her heart rate was in the 70s.  Typically her heart rate is in the 60s.  She checked her blood pressure and was over 200 systolic, which prompted her to come to the ER.  Patient has no known history of coronary artery disease, strokes.  She denies any episodes of syncope recently or exertional chest pain or shortness of breath.  Past Medical History:  Diagnosis Date  . Diabetes mellitus without complication (HCC)    Type 2  . Graves disease   . Hypertension   . Thyroid disease     There are no active problems to display for this patient.   Past Surgical History:  Procedure Laterality Date  . VAGINAL HYSTERECTOMY       OB History   No obstetric history on file.      Home Medications    Prior to Admission medications   Medication Sig Start Date End Date Taking? Authorizing Provider  amLODipine (NORVASC) 10 MG tablet Take 10 mg by mouth at bedtime.    Yes [provider]  aspirin EC 81 MG tablet Take 81 mg by mouth daily.   Yes [provider]  atorvastatin (LIPITOR) 40 MG tablet Take 40 mg by mouth every other day. AT BEDTIME   Yes [provider]  busPIRone (BUSPAR) 10 MG tablet Take 10 mg by mouth  daily.    Yes [provider]  carboxymethylcellulose (REFRESH PLUS) 0.5 % SOLN Place 1 drop into both eyes daily as needed (dry eyes).    Yes [provider]  carvedilol (COREG) 25 MG tablet Take 25 mg by mouth 2 (two) times daily.    Yes [provider]  cloNIDine (CATAPRES) 0.1 MG tablet Take 0.1 mg by mouth daily as needed (bp >170/90).   Yes [provider]  diclofenac sodium (VOLTAREN) 1 % GEL Apply 1 application topically daily as needed (arthritis pain).   Yes [provider]  gabapentin (NEURONTIN) 100 MG capsule Take 200 mg by mouth 2 (two) times daily as needed (for Restless Legs Syndrome).    Yes [provider]  hydrochlorothiazide (HYDRODIURIL) 50 MG tablet Take 50 mg by mouth daily.   Yes [provider]  ibuprofen (ADVIL) 800 MG tablet Take 800 mg by mouth every 8 (eight) hours as needed (arthritis pain).   Yes [provider]  levothyroxine (SYNTHROID, LEVOTHROID) 88 MCG tablet Take 1 tablet (88 mcg total) by mouth daily before breakfast. 07/21/17  Yes Petrucelli, Samantha R, PA-C  losartan (COZAAR) 100 MG tablet Take 100 mg by mouth daily.   Yes [provider]  magnesium oxide (MAG-OX) 400 MG tablet Take 800 mg by mouth daily.   Yes  [provider]  metFORMIN (GLUCOPHAGE) 500 MG tablet Take 500 mg by mouth See admin instructions. Take 500 mg by mouth in the morning before breakfast and an additional 500 mg at bedtime if needed for CBG >170   Yes [provider]  mupirocin cream (BACTROBAN) 2 % Apply to affected area 3 times daily Patient taking differently: Apply 1 application topically 3 (three) times daily. Until burn is healed 12/17/18 12/17/19 Yes Elson Areas, PA-C  omeprazole (PRILOSEC) 40 MG capsule Take 40 mg by mouth daily.   Yes [provider]  potassium chloride 20 MEQ/15ML (10%) SOLN Take 40 mEq by mouth 2 (two) times daily.    Yes [provider]   traZODone (DESYREL) 50 MG tablet Take 25 mg by mouth at bedtime as needed (sleep).    Yes [provider]  famotidine (PEPCID) 20 MG tablet Take 1 tablet (20 mg total) by mouth 2 (two) times daily. Patient not taking: Reported on 12/29/2018 07/23/18   Pricilla Loveless, MD  ondansetron (ZOFRAN ODT) 4 MG disintegrating tablet Take 1 tablet (4 mg total) by mouth every 8 (eight) hours as needed for nausea or vomiting. Patient not taking: Reported on 12/29/2018 04/29/18   Terrilee Files, MD  pantoprazole (PROTONIX) 40 MG tablet Take 1 tablet (40 mg total) by mouth daily. Patient not taking: Reported on 12/29/2018 07/23/18   Pricilla Loveless, MD    Family History History reviewed. No pertinent family history.  Social History Social History   Tobacco Use  . Smoking status: Never Smoker  . Smokeless tobacco: Never Used  Substance Use Topics  . Alcohol use: No  . Drug use: Not on file     Allergies   Keflex [cephalexin] and Sudafed [pseudoephedrine hcl]   Review of Systems Review of Systems  Constitutional: Positive for activity change.  Respiratory: Negative for shortness of breath.   Cardiovascular: Positive for palpitations. Negative for chest pain.  Neurological: Negative for dizziness, speech difficulty, weakness, light-headedness, numbness and headaches.  Hematological: Does not bruise/bleed easily.  All other systems reviewed and are negative.    Physical Exam Updated Vital Signs BP (!) 179/85   Pulse (!) 53   Temp 98 F (36.7 C) (Oral)   Resp 12   Ht  (1.651 m)   Wt 75.8 kg   SpO2 97%   BMI 27.79 kg/m   Physical Exam Vitals signs and nursing note reviewed.  Constitutional:      Appearance: She is well-developed.  HENT:     Head: Normocephalic and atraumatic.  Neck:     Musculoskeletal: Normal range of motion and neck supple.  Cardiovascular:     Rate and Rhythm: Normal rate.     Heart sounds: No murmur.  Pulmonary:     Effort: Pulmonary  effort is normal.  Abdominal:     General: Bowel sounds are normal.  Musculoskeletal:     Right lower leg: No edema.     Left lower leg: No edema.  Skin:    General: Skin is warm and dry.  Neurological:     Mental Status: She is alert and oriented to person, place, and time.      ED Treatments / Results  Labs (all labs ordered are listed, but only abnormal results are displayed) Labs Reviewed  BASIC METABOLIC PANEL - Abnormal; Notable for the following components:      Result Value   Glucose, Bld 205 (*)    GFR calc non Af Denyse Dago  59 (*)    All other components within normal limits  CBC WITH DIFFERENTIAL/PLATELET - Abnormal; Notable for the following components:   Hemoglobin 11.9 (*)    All other components within normal limits    EKG EKG Interpretation  Date/Time:  Sunday Dec 29 2018 20:46:40 EDT Ventricular Rate:  55 PR Interval:    QRS Duration: 92 QT Interval:  460 QTC Calculation: 440 R Axis:   55 Text Interpretation:  Sinus rhythm Consider left atrial enlargement No acute changes No significant change since last tracing Confirmed by Derwood Kaplan [phone]) on 12/29/2018 8:55:52 PM   Radiology No results found.  Procedures Procedures (including critical care time)  Medications Ordered in ED Medications - No data to display   Initial Impression / Assessment and Plan / ED Course  I have reviewed the triage vital signs and the nursing notes.  Pertinent labs & imaging results that were available during my care of the patient were reviewed by me and considered in my medical decision making (see chart for details).        DDx includes: Orthostatic hypotension Stroke Vertebral artery dissection/stenosis Dysrhythmia PE Vasovagal/neurocardiogenic syncope Aortic stenosis Valvular disorder/Cardiomyopathy Anemia  68 year old female comes in a chief complaint of dizziness, elevated blood pressure.  She also had some palpitations.  She has no known coronary  artery disease or strokes.  She does have diabetes and hypertension.  Her neurologic exam is completely nonfocal.  She has no cardiopulmonary or neurologic symptoms surrounding her event of severe weakness and questionable syncope.  Unclear as to what the process is.  History is not suggestive of any underlying infection including COVID-19.  We kept patient on cardiac monitor and ordered basic labs which are reassuring.  She did have PVCs on monitor, which could explain the palpitations she is having.  Patient is low risk for any complications for syncope even if she had syncope.  Again it is unclear if she actually had a syncopal episode in first place.  The patient appears reasonably screened and/or stabilized for discharge and I doubt any other medical condition or other [facility] requiring further screening, evaluation, or treatment in the ED at this time prior to discharge.   Results from the ER workup discussed with the patient face to face and all questions answered to the best of my ability. The patient is safe for discharge with strict return precautions.  Final Clinical Impressions(s) / ED Diagnoses   Final diagnoses:  PVC (premature ventricular contraction)  Dizziness    ED Discharge Orders    None       Derwood Kaplan, MD 12/29/18 2304

## 2018-12-29 NOTE — ED Notes (Signed)
Patient verbalizes understanding of discharge instructions. Opportunity for questioning and answers were provided. Armband removed by staff, pt discharged from ED.  

## 2019-12-03 ENCOUNTER — Observation Stay (HOSPITAL_COMMUNITY)
Admission: EM | Admit: 2019-12-03 | Discharge: 2019-12-04 | Disposition: A | Discharge status: Home / Self Care | Payer: No Typology Code available for payment source | Attending: Internal Medicine | Admitting: Internal Medicine

## 2019-12-03 ENCOUNTER — Encounter (HOSPITAL_COMMUNITY): Payer: Self-pay | Admitting: Emergency Medicine

## 2019-12-03 ENCOUNTER — Other Ambulatory Visit: Payer: Self-pay

## 2019-12-03 ENCOUNTER — Emergency Department (HOSPITAL_COMMUNITY): Payer: No Typology Code available for payment source

## 2019-12-03 DIAGNOSIS — I493 Ventricular premature depolarization: Secondary | ICD-10-CM | POA: Diagnosis not present

## 2019-12-03 DIAGNOSIS — E119 Type 2 diabetes mellitus without complications: Secondary | ICD-10-CM | POA: Diagnosis not present

## 2019-12-03 DIAGNOSIS — I119 Hypertensive heart disease without heart failure: Secondary | ICD-10-CM | POA: Insufficient documentation

## 2019-12-03 DIAGNOSIS — Z7984 Long term (current) use of oral hypoglycemic drugs: Secondary | ICD-10-CM | POA: Insufficient documentation

## 2019-12-03 DIAGNOSIS — E118 Type 2 diabetes mellitus with unspecified complications: Secondary | ICD-10-CM

## 2019-12-03 DIAGNOSIS — E0500 Thyrotoxicosis with diffuse goiter without thyrotoxic crisis or storm: Secondary | ICD-10-CM | POA: Diagnosis not present

## 2019-12-03 DIAGNOSIS — I10 Essential (primary) hypertension: Secondary | ICD-10-CM | POA: Diagnosis not present

## 2019-12-03 DIAGNOSIS — R079 Chest pain, unspecified: Secondary | ICD-10-CM | POA: Diagnosis present

## 2019-12-03 DIAGNOSIS — Z8619 Personal history of other infectious and parasitic diseases: Secondary | ICD-10-CM | POA: Diagnosis not present

## 2019-12-03 DIAGNOSIS — I160 Hypertensive urgency: Secondary | ICD-10-CM | POA: Diagnosis not present

## 2019-12-03 DIAGNOSIS — Z79890 Hormone replacement therapy (postmenopausal): Secondary | ICD-10-CM | POA: Diagnosis not present

## 2019-12-03 DIAGNOSIS — Z79899 Other long term (current) drug therapy: Secondary | ICD-10-CM | POA: Insufficient documentation

## 2019-12-03 DIAGNOSIS — I2510 Atherosclerotic heart disease of native coronary artery without angina pectoris: Secondary | ICD-10-CM | POA: Diagnosis not present

## 2019-12-03 DIAGNOSIS — F419 Anxiety disorder, unspecified: Secondary | ICD-10-CM | POA: Insufficient documentation

## 2019-12-03 DIAGNOSIS — Z881 Allergy status to other antibiotic agents status: Secondary | ICD-10-CM | POA: Diagnosis not present

## 2019-12-03 DIAGNOSIS — Z20822 Contact with and (suspected) exposure to covid-19: Secondary | ICD-10-CM | POA: Insufficient documentation

## 2019-12-03 DIAGNOSIS — E039 Hypothyroidism, unspecified: Secondary | ICD-10-CM | POA: Insufficient documentation

## 2019-12-03 DIAGNOSIS — F329 Major depressive disorder, single episode, unspecified: Secondary | ICD-10-CM | POA: Insufficient documentation

## 2019-12-03 DIAGNOSIS — R0789 Other chest pain: Secondary | ICD-10-CM | POA: Diagnosis not present

## 2019-12-03 DIAGNOSIS — R42 Dizziness and giddiness: Secondary | ICD-10-CM

## 2019-12-03 LAB — SARS CORONAVIRUS 2 (TAT 6-24 HRS): SARS Coronavirus 2: NEGATIVE

## 2019-12-03 LAB — CBC
HCT: 44.1 % (ref 36.0–46.0)
Hemoglobin: 14.5 g/dL (ref 12.0–15.0)
MCH: 27.5 pg (ref 26.0–34.0)
MCHC: 32.9 g/dL (ref 30.0–36.0)
MCV: 83.5 fL (ref 80.0–100.0)
Platelets: 306 10*3/uL (ref 150–400)
RBC: 5.28 MIL/uL — ABNORMAL HIGH (ref 3.87–5.11)
RDW: 14.8 % (ref 11.5–15.5)
WBC: 7 10*3/uL (ref 4.0–10.5)
nRBC: 0 % (ref 0.0–0.2)

## 2019-12-03 LAB — BASIC METABOLIC PANEL
Anion gap: 13 (ref 5–15)
BUN: 17 mg/dL (ref 8–23)
CO2: 26 mmol/L (ref 22–32)
Calcium: 9.6 mg/dL (ref 8.9–10.3)
Chloride: 97 mmol/L — ABNORMAL LOW (ref 98–111)
Creatinine, Ser: 1.05 mg/dL — ABNORMAL HIGH (ref 0.44–1.00)
GFR calc Af Amer: >60 mL/min (ref >60)
GFR calc non Af Amer: 55 mL/min — ABNORMAL LOW (ref >60)
Glucose, Bld: 133 mg/dL — ABNORMAL HIGH (ref 70–99)
Potassium: 3.9 mmol/L (ref 3.5–5.1)
Sodium: 136 mmol/L (ref 135–145)

## 2019-12-03 LAB — HEPATIC FUNCTION PANEL
ALT: 25 U/L (ref 0–44)
AST: 38 U/L (ref 15–41)
Albumin: 4.5 g/dL (ref 3.5–5.0)
Alkaline Phosphatase: 66 U/L (ref 38–126)
Bilirubin, Direct: 0.2 mg/dL (ref 0.0–0.2)
Indirect Bilirubin: 0.4 mg/dL (ref 0.3–0.9)
Total Bilirubin: 0.6 mg/dL (ref 0.3–1.2)
Total Protein: 8.1 g/dL (ref 6.5–8.1)

## 2019-12-03 LAB — TROPONIN I (HIGH SENSITIVITY)
Troponin I (High Sensitivity): 2 ng/L (ref <18)
Troponin I (High Sensitivity): 4 ng/L (ref <18)

## 2019-12-03 LAB — GLUCOSE, CAPILLARY
Glucose-Capillary: 82 mg/dL (ref 70–99)
Glucose-Capillary: 123 mg/dL — ABNORMAL HIGH (ref 70–99)

## 2019-12-03 LAB — MAGNESIUM: Magnesium: 1.5 mg/dL — ABNORMAL LOW (ref 1.7–2.4)

## 2019-12-03 MED ORDER — ASPIRIN 81 MG PO CHEW
324.0000 mg | CHEWABLE_TABLET | Freq: Once | ORAL | Status: AC
  Administered 2019-12-03: 324 mg via ORAL
  Filled 2019-12-03: qty 4

## 2019-12-03 MED ORDER — SODIUM CHLORIDE 0.9% FLUSH
3.0000 mL | Freq: Once | INTRAVENOUS | Status: AC
  Administered 2019-12-03: 3 mL via INTRAVENOUS

## 2019-12-03 MED ORDER — LIDOCAINE 5 % EX PTCH
1.0000 | MEDICATED_PATCH | Freq: Every day | CUTANEOUS | Status: DC | PRN
  Filled 2019-12-03: qty 1

## 2019-12-03 MED ORDER — FLUTICASONE PROPIONATE 50 MCG/ACT NA SUSP
1.0000 | Freq: Every day | NASAL | Status: DC | PRN
  Administered 2019-12-04: 1 via NASAL
  Filled 2019-12-03 (×3): qty 16

## 2019-12-03 MED ORDER — ACETAMINOPHEN 325 MG PO TABS: 650.0000 mg | ORAL_TABLET | ORAL | Status: DC | PRN

## 2019-12-03 MED ORDER — ALUM & MAG HYDROXIDE-SIMETH 200-200-20 MG/5ML PO SUSP: 30.0000 mL | Freq: Four times a day (QID) | ORAL | Status: DC | PRN

## 2019-12-03 MED ORDER — INSULIN ASPART 100 UNIT/ML ~~LOC~~ SOLN
0.0000 [IU] | Freq: Every day | SUBCUTANEOUS | Status: DC
  Filled 2019-12-03: qty 0.05

## 2019-12-03 MED ORDER — ASPIRIN EC 81 MG PO TBEC
81.0000 mg | DELAYED_RELEASE_TABLET | Freq: Every day | ORAL | Status: DC
  Administered 2019-12-04: 81 mg via ORAL
  Filled 2019-12-03: qty 1

## 2019-12-03 MED ORDER — CARVEDILOL 25 MG PO TABS
25.0000 mg | ORAL_TABLET | Freq: Two times a day (BID) | ORAL | Status: DC
  Administered 2019-12-03 – 2019-12-04 (×3): 25 mg via ORAL
  Filled 2019-12-03 (×3): qty 1

## 2019-12-03 MED ORDER — HYDROCHLOROTHIAZIDE 25 MG PO TABS
50.0000 mg | ORAL_TABLET | Freq: Every day | ORAL | Status: DC
  Administered 2019-12-04: 50 mg via ORAL
  Filled 2019-12-03 (×2): qty 2

## 2019-12-03 MED ORDER — SALINE SPRAY 0.65 % NA SOLN
1.0000 | NASAL | Status: DC | PRN
  Filled 2019-12-03: qty 44

## 2019-12-03 MED ORDER — NITROGLYCERIN 0.4 MG SL SUBL
0.4000 mg | SUBLINGUAL_TABLET | SUBLINGUAL | Status: DC | PRN
  Administered 2019-12-03: 0.4 mg via SUBLINGUAL
  Filled 2019-12-03: qty 1

## 2019-12-03 MED ORDER — INSULIN ASPART 100 UNIT/ML ~~LOC~~ SOLN
0.0000 [IU] | Freq: Three times a day (TID) | SUBCUTANEOUS | Status: DC
  Administered 2019-12-04: 2 [IU] via SUBCUTANEOUS
  Filled 2019-12-03: qty 0.09

## 2019-12-03 MED ORDER — BUSPIRONE HCL 10 MG PO TABS
10.0000 mg | ORAL_TABLET | Freq: Every day | ORAL | Status: DC
  Administered 2019-12-04: 10 mg via ORAL
  Filled 2019-12-03: qty 1

## 2019-12-03 MED ORDER — LEVOTHYROXINE SODIUM 88 MCG PO TABS
88.0000 ug | ORAL_TABLET | Freq: Every day | ORAL | Status: DC
  Administered 2019-12-04: 88 ug via ORAL
  Filled 2019-12-03: qty 1

## 2019-12-03 MED ORDER — LOSARTAN POTASSIUM 50 MG PO TABS
100.0000 mg | ORAL_TABLET | Freq: Every day | ORAL | Status: DC
  Administered 2019-12-03 – 2019-12-04 (×2): 100 mg via ORAL
  Filled 2019-12-03 (×2): qty 2

## 2019-12-03 MED ORDER — PANTOPRAZOLE SODIUM 40 MG PO TBEC
40.0000 mg | DELAYED_RELEASE_TABLET | Freq: Every day | ORAL | Status: DC
  Administered 2019-12-03 – 2019-12-04 (×2): 40 mg via ORAL
  Filled 2019-12-03 (×2): qty 1

## 2019-12-03 MED ORDER — TRAZODONE HCL 50 MG PO TABS: 25.0000 mg | ORAL_TABLET | Freq: Every evening | ORAL | Status: DC | PRN

## 2019-12-03 MED ORDER — GABAPENTIN 100 MG PO CAPS: 200.0000 mg | ORAL_CAPSULE | Freq: Two times a day (BID) | ORAL | Status: DC | PRN

## 2019-12-03 MED ORDER — MAGNESIUM SULFATE 2 GM/50ML IV SOLN
2.0000 g | Freq: Once | INTRAVENOUS | Status: AC
  Administered 2019-12-03: 2 g via INTRAVENOUS
  Filled 2019-12-03: qty 50

## 2019-12-03 MED ORDER — AMLODIPINE BESYLATE 5 MG PO TABS
10.0000 mg | ORAL_TABLET | Freq: Every day | ORAL | Status: DC
  Administered 2019-12-03: 10 mg via ORAL
  Filled 2019-12-03: qty 2

## 2019-12-03 MED ORDER — POLYVINYL ALCOHOL 1.4 % OP SOLN
1.0000 [drp] | Freq: Every day | OPHTHALMIC | Status: DC | PRN
  Administered 2019-12-04: 1 [drp] via OPHTHALMIC
  Filled 2019-12-03 (×3): qty 15

## 2019-12-03 MED ORDER — ENOXAPARIN SODIUM 40 MG/0.4ML ~~LOC~~ SOLN
40.0000 mg | SUBCUTANEOUS | Status: DC
  Filled 2019-12-03: qty 0.4

## 2019-12-03 MED ORDER — MAGNESIUM OXIDE 400 (241.3 MG) MG PO TABS
800.0000 mg | ORAL_TABLET | Freq: Every day | ORAL | Status: DC
  Administered 2019-12-03 – 2019-12-04 (×2): 800 mg via ORAL
  Filled 2019-12-03 (×2): qty 2

## 2019-12-03 MED ORDER — ATORVASTATIN CALCIUM 40 MG PO TABS
80.0000 mg | ORAL_TABLET | Freq: Every day | ORAL | Status: DC
  Filled 2019-12-03: qty 2

## 2019-12-03 MED ORDER — ONDANSETRON HCL 4 MG/2ML IJ SOLN: 4.0000 mg | Freq: Four times a day (QID) | INTRAMUSCULAR | Status: DC | PRN

## 2019-12-03 NOTE — ED Triage Notes (Signed)
Pt c/o central chest pains that has been constant this morning. Reports that she dealing with h pylori for couple years.

## 2019-12-03 NOTE — Consult Note (Addendum)
CARDIOLOGY CONSULT NOTE  Patient ID: Stacy Nash MRN: [id number] DOB/AGE: [birth year] 69 y.o.  Admit date: 12/03/2019 Referring Physician: [facility] Reason for Consultation:  Chest pain  HPI:    69 y.o. African American female  with hypertension, type 2 DM, Grave's disease, hypothyroidism, anxiety/depression, admitted with complaints of chest pain.  Patient had retrosternal chest pressure, radiating to left shoulder, that lasted for several hours.  Pain started on 4/20 evening and continued all morning on 4/21.  Pain relieved by the time patient was in [facility] hospital.  Work-up so far has showed negative high-sensitivity troponin x2, EKG with PVCs without any ischemic changes.  Magnesium was found to be low at 1.5.  Patient is currently chest pain-free.  At baseline, patient sees a [facility] at [facility]-Dr. Lorretta Harp.  She sees PCP Dr. Jannetta Quint. [place] with [facility], She last had echocardiogram in 2011 when she was in [place].  At baseline, she exercises 3 times a week without any complaints of chest pain or shortness of breath.  She has history of Graves' disease for which she underwent thyroidectomy at [facility] in 2016, and has been on Synthroid since then.    Past Medical History:  Diagnosis Date  . Diabetes mellitus without complication (HCC)    Type 2  . Graves disease   . Hypertension   . Thyroid disease      Past Surgical History:  Procedure Laterality Date  . VAGINAL HYSTERECTOMY       No family history on file.   Social History: Social History   Socioeconomic History  . Marital status: Single    Spouse name: Not on file  . Number of children: Not on file  . Years of education: Not on file  . Highest education level: Not on file  Occupational History  . Not on file  Tobacco Use  . Smoking status: Never Smoker  . Smokeless tobacco: Never Used  Substance and Sexual Activity  . Alcohol use: No  . Drug use: Not on file  . Sexual  activity: Never  Other Topics Concern  . Not on file  Social History Narrative  . Not on file   Social Determinants of Health   Financial Resource Strain:   . Difficulty of Paying Living Expenses:   Food Insecurity:   . Worried About Programme researcher, broadcasting/film/video in the Last Year:   . Barista in the Last Year:   Transportation Needs:   . Freight forwarder (Medical):   Marland Kitchen Lack of Transportation (Non-Medical):   Physical Activity:   . Days of Exercise per Week:   . Minutes of Exercise per Session:   Stress:   . Feeling of Stress :   Social Connections:   . Frequency of Communication with Friends and Family:   . Frequency of Social Gatherings with Friends and Family:   . Attends Religious Services:   . Active Member of Clubs or Organizations:   . Attends Banker Meetings:   Marland Kitchen Marital Status:   Intimate Partner Violence:   . Fear of Current or Ex-Partner:   . Emotionally Abused:   Marland Kitchen Physically Abused:   . Sexually Abused:      Medications Prior to Admission  Medication Sig Dispense Refill Last Dose  . amLODipine (NORVASC) 10 MG tablet Take 10 mg by mouth at bedtime.    12/02/2019 at Unknown time  . busPIRone (BUSPAR) 10 MG tablet Take 10 mg by mouth daily.  12/03/2019 at Unknown time  . carboxymethylcellulose (REFRESH PLUS) 0.5 % SOLN Place 1 drop into both eyes daily as needed (dry eyes).    12/02/2019 at Unknown time  . carvedilol (COREG) 25 MG tablet Take 25 mg by mouth 2 (two) times daily.    12/03/2019 at 7am  . diclofenac sodium (VOLTAREN) 1 % GEL Apply 1 application topically daily as needed (arthritis pain).   12/02/2019 at Unknown time  . fluticasone (FLONASE) 50 MCG/ACT nasal spray Place 1-2 sprays into both nostrils daily as needed for allergies or rhinitis.   12/03/2019 at Unknown time  . gabapentin (NEURONTIN) 100 MG capsule Take 200 mg by mouth 2 (two) times daily as needed (for Restless Legs Syndrome).    Past Week at Unknown time  .  hydrochlorothiazide (HYDRODIURIL) 50 MG tablet Take 50 mg by mouth daily.   12/03/2019 at Unknown time  . levothyroxine (SYNTHROID, LEVOTHROID) 88 MCG tablet Take 1 tablet (88 mcg total) by mouth daily before breakfast. 30 tablet 0 12/03/2019 at Unknown time  . lidocaine (LIDODERM) 5 % Place 1 patch onto the skin daily as needed (back pain). Remove & Discard patch within 12 hours or as directed by MD   Past Week at Unknown time  . losartan (COZAAR) 100 MG tablet Take 100 mg by mouth daily.   12/03/2019 at Unknown time  . magnesium oxide (MAG-OX) 400 MG tablet Take 800 mg by mouth daily.   12/02/2019 at Unknown time  . metFORMIN (GLUCOPHAGE) 500 MG tablet Take 500 mg by mouth See admin instructions. Take 500 mg by mouth in the morning before breakfast and an additional 500 mg at bedtime if needed for CBG >170   12/03/2019 at Unknown time  . naproxen sodium (ALEVE) 220 MG tablet Take 220 mg by mouth 2 (two) times daily as needed (headache/pain).   12/02/2019 at Unknown time  . omeprazole (PRILOSEC) 40 MG capsule Take 40 mg by mouth daily.   12/03/2019 at Unknown time  . potassium chloride 20 MEQ/15ML (10%) SOLN Take 30 mEq by mouth 2 (two) times daily.    12/03/2019 at Unknown time  . sodium chloride (OCEAN) 0.65 % SOLN nasal spray Place 1 spray into both nostrils as needed for congestion.   Past Week at Unknown time  . traZODone (DESYREL) 50 MG tablet Take 25 mg by mouth at bedtime as needed (sleep).    Past Week at Unknown time  . famotidine (PEPCID) 20 MG tablet Take 1 tablet (20 mg total) by mouth 2 (two) times daily. (Patient not taking: Reported on 12/29/2018) 30 tablet 0 Not Taking at Unknown time  . mupirocin cream (BACTROBAN) 2 % Apply to affected area 3 times daily (Patient not taking: Reported on 12/03/2019) 30 g 0 Not Taking at Unknown time  . ondansetron (ZOFRAN ODT) 4 MG disintegrating tablet Take 1 tablet (4 mg total) by mouth every 8 (eight) hours as needed for nausea or vomiting. (Patient not  taking: Reported on 12/29/2018) 15 tablet 0 Not Taking at Unknown time  . pantoprazole (PROTONIX) 40 MG tablet Take 1 tablet (40 mg total) by mouth daily. (Patient not taking: Reported on 12/29/2018) 30 tablet 0 Not Taking at Unknown time    Review of Systems  Constitution: Negative for decreased appetite, malaise/fatigue, weight gain and weight loss.  HENT: Negative for congestion.   Eyes: Negative for visual disturbance.  Cardiovascular: Positive for chest pain (Now resolved) and palpitations. Negative for dyspnea on exertion, leg swelling and syncope.  Respiratory: Negative for cough.   Endocrine: Negative for cold intolerance.  Hematologic/Lymphatic: Does not bruise/bleed easily.  Skin: Negative for itching and rash.  Musculoskeletal: Negative for myalgias.  Gastrointestinal: Negative for abdominal pain, nausea and vomiting.  Genitourinary: Negative for dysuria.  Neurological: Negative for dizziness and weakness.  Psychiatric/Behavioral: The patient is not nervous/anxious.   All other systems reviewed and are negative.     Physical Exam: Physical Exam  Constitutional: She is oriented to person, place, and time. She appears well-developed and well-nourished. No distress.  HENT:  Head: Normocephalic and atraumatic.  Eyes: Pupils are equal, round, and reactive to light. Conjunctivae are normal.  Neck: No JVD present.  Cardiovascular: Normal rate, regular rhythm and intact distal pulses.  Pulmonary/Chest: Effort normal and breath sounds normal. She has no wheezes. She has no rales.  Abdominal: Soft. Bowel sounds are normal. There is no rebound.  Musculoskeletal:        General: No edema.  Lymphadenopathy:    She has no cervical adenopathy.  Neurological: She is alert and oriented to person, place, and time. No cranial nerve deficit.  Skin: Skin is warm and dry.  Psychiatric: She has a normal mood and affect.  Nursing note and vitals reviewed.    Labs:   Lab Results   Component Value Date   WBC 7.0 12/03/2019   HGB 14.5 12/03/2019   HCT 44.1 12/03/2019   MCV 83.5 12/03/2019   PLT 306 12/03/2019    Recent Labs  Lab 12/03/19 1050  NA 136  K 3.9  CL 97*  CO2 26  BUN 17  CREATININE 1.05*  CALCIUM 9.6  PROT 8.1  BILITOT 0.6  ALKPHOS 66  ALT 25  AST 38  GLUCOSE 133*   Results for AMILY, DEPP (MRN [id number]) as of 12/03/2019 19:33  Ref. Range 12/03/2019 10:50 12/03/2019 10:51 12/03/2019 12:43  Troponin I (High Sensitivity) Latest Ref Range: <18 ng/L 2  4    Radiology: [facility] Chest Port 1 View  Result Date: 12/03/2019 CLINICAL DATA:  Dizziness.  Chest pain today. EXAM: PORTABLE CHEST 1 VIEW COMPARISON:  06/08/2016 FINDINGS: Cardiac silhouette is normal in size. No mediastinal or hilar masses. No evidence of adenopathy. Lungs are hyperexpanded, but clear. No pleural effusion or pneumothorax. Skeletal structures are grossly intact. IMPRESSION: No active disease. Electronically Signed   By: Lajean Manes M.D.   On: 12/03/2019 11:39    Scheduled Meds: . amLODipine  10 mg Oral QHS  . [START ON 12/04/2019] aspirin EC  81 mg Oral Daily  . atorvastatin  80 mg Oral QHS  . [START ON 12/04/2019] busPIRone  10 mg Oral Daily  . carvedilol  25 mg Oral BID WC  . enoxaparin (LOVENOX) injection  40 mg Subcutaneous Q24H  . [START ON 12/04/2019] hydrochlorothiazide  50 mg Oral Daily  . insulin aspart  0-5 Units Subcutaneous QHS  . insulin aspart  0-9 Units Subcutaneous TID WC  . [START ON 12/04/2019] levothyroxine  88 mcg Oral QAC breakfast  . losartan  100 mg Oral Daily  . magnesium oxide  800 mg Oral Daily  . pantoprazole  40 mg Oral Daily   Continuous Infusions: PRN Meds:.acetaminophen, alum & mag hydroxide-simeth, [START ON 12/04/2019] fluticasone, gabapentin, lidocaine, nitroGLYCERIN, ondansetron (ZOFRAN) IV, polyvinyl alcohol, sodium chloride, traZODone  CARDIAC STUDIES:  EKG 12/03/2019: Sinus rhythm. Frequent PVC's. Borderline left atrial enlargment.    Echocardiogram pending:   Assessment & Recommendations:  69 y.o. African American female  with hypertension, type 2  DM, Grave's disease, hypothyroidism, anxiety/depression, admitted with complaints of chest pain.  Chest pain: Not acute coronary syndrome.  EKG negative for ischemia.  High sensitive troponin negative x2.  I suspect her pain may be related to symptomatic PVCs.  Recommend echocardiogram.  Could consider outpatient exercise nuclear stress test.  Since she has an established cardiologist at [facility], I will defer stress testing and follow-up to them. Could also consider outpatient Holter monitor. Continue current antihypertensive therapy including carvedilol.  Replace magnesium to keep >2, K around 4.  Cardiology signing off, unless echocardiogram shows any significant abnormality.  Continue follow-up with cardiologist Dr. Lorretta Harp, and PCP Dr. Paulino Door, MD 12/03/2019, 7:33 PM [facility] [place] Pager: [phone] Office: [phone] If no answer Cell [phone]

## 2019-12-03 NOTE — H&P (Signed)
History and Physical    Stacy Nash UUV:[id number] DOB: June 21, 1951 DOA: 12/03/2019  PCP: Stacy Dauer, MD   Patient coming from: Home  I have personally briefly reviewed patient's old medical records in [place]  Chief Complaint: Chest pain  HPI: Stacy Nash is a 69 y.o. female with medical history significant of hypertension, diabetes mellitus type 2, hypothyroidism, anxiety/depression presented with chest pain.  Patient states that she had sudden onset of mostly substernal chest pain, pressure like in nature, 7-8 out of 10 in intensity, radiating to the left shoulder with no aggravating or relieving factors associated with mild shortness of breath, nausea but no vomiting, cough, fever, abdominal pain, diarrhea or dysuria.  She felt dizzy and generally weak and felt more anxious.  Patient denies any syncope or loss of consciousness.  No trauma to chest.  Patient has had first dose of Covid vaccine and is due for her second dose.  ED Course: First high-sensitivity troponin was 2.  EKG was unremarkable.  Patient continued to have chest pressure.  Hospitalist service was called to evaluate the patient.  Review of Systems: As per HPI otherwise all other systems were reviewed and were negative.  Past Medical History:  Diagnosis Date  . Diabetes mellitus without complication (HCC)    Type 2  . Graves disease   . Hypertension   . Thyroid disease     Past Surgical History:  Procedure Laterality Date  . VAGINAL HYSTERECTOMY     Social history  reports that she has never smoked. She has never used smokeless tobacco. She reports that she does not drink alcohol. No history on file for drug.  Allergies  Allergen Reactions  . Keflex [Cephalexin] Palpitations  . Sudafed [Pseudoephedrine Hcl] Palpitations    No family history on file.  Prior to Admission medications   Medication Sig Start Date End Date Taking? Authorizing Provider  amLODipine (NORVASC) 10 MG tablet Take 10  mg by mouth at bedtime.    Yes [provider]  busPIRone (BUSPAR) 10 MG tablet Take 10 mg by mouth daily.    Yes [provider]  carboxymethylcellulose (REFRESH PLUS) 0.5 % SOLN Place 1 drop into both eyes daily as needed (dry eyes).    Yes [provider]  carvedilol (COREG) 25 MG tablet Take 25 mg by mouth 2 (two) times daily.    Yes [provider]  diclofenac sodium (VOLTAREN) 1 % GEL Apply 1 application topically daily as needed (arthritis pain).   Yes [provider]  fluticasone (FLONASE) 50 MCG/ACT nasal spray Place 1-2 sprays into both nostrils daily as needed for allergies or rhinitis.   Yes [provider]  gabapentin (NEURONTIN) 100 MG capsule Take 200 mg by mouth 2 (two) times daily as needed (for Restless Legs Syndrome).    Yes [provider]  hydrochlorothiazide (HYDRODIURIL) 50 MG tablet Take 50 mg by mouth daily.   Yes [provider]  levothyroxine (SYNTHROID, LEVOTHROID) 88 MCG tablet Take 1 tablet (88 mcg total) by mouth daily before breakfast. 07/21/17  Yes Petrucelli, Stacy R, PA-C  lidocaine (LIDODERM) 5 % Place 1 patch onto the skin daily as needed (back pain). Remove & Discard patch within 12 hours or as directed by MD   Yes [provider]  losartan (COZAAR) 100 MG tablet Take 100 mg by mouth daily.   Yes [provider]  magnesium oxide (MAG-OX) 400 MG tablet Take 800 mg by mouth daily.   Yes [provider]  metFORMIN (GLUCOPHAGE) 500 MG tablet Take 500 mg by mouth See admin instructions. Take 500 mg by mouth in the morning before breakfast and an additional 500 mg at bedtime if needed for CBG >170   Yes [provider]  naproxen sodium (ALEVE) 220 MG tablet Take 220 mg by mouth 2 (two) times daily as needed (headache/pain).   Yes [provider]  omeprazole (PRILOSEC) 40 MG capsule Take 40 mg by mouth daily.   Yes [provider]  potassium  chloride 20 MEQ/15ML (10%) SOLN Take 30 mEq by mouth 2 (two) times daily.    Yes [provider]  sodium chloride (OCEAN) 0.65 % SOLN nasal spray Place 1 spray into both nostrils as needed for congestion.   Yes [provider]  traZODone (DESYREL) 50 MG tablet Take 25 mg by mouth at bedtime as needed (sleep).    Yes [provider]  famotidine (PEPCID) 20 MG tablet Take 1 tablet (20 mg total) by mouth 2 (two) times daily. Patient not taking: Reported on 12/29/2018 07/23/18   Pricilla Loveless, MD  mupirocin cream Stacy Nash) 2 % Apply to affected area 3 times daily Patient not taking: Reported on 12/03/2019 12/17/18 12/17/19  Elson Areas, PA-C  ondansetron (ZOFRAN ODT) 4 MG disintegrating tablet Take 1 tablet (4 mg total) by mouth every 8 (eight) hours as needed for nausea or vomiting. Patient not taking: Reported on 12/29/2018 04/29/18   Stacy Files, MD  pantoprazole (PROTONIX) 40 MG tablet Take 1 tablet (40 mg total) by mouth daily. Patient not taking: Reported on 12/29/2018 07/23/18   Pricilla Loveless, MD    Physical Exam: Vitals:   12/03/19 1200 12/03/19 1230 12/03/19 1300 12/03/19 1330  BP: (!) 158/88 (!) 148/87 (!) 153/81 (!) 167/88  Pulse: 67 70 65 65  Resp: 12 12 14 14   Temp:      TempSrc:      SpO2: 100% 99% 100% 97%    Constitutional: NAD, calm, comfortable.  Anxious looking. Vitals:   12/03/19 1200 12/03/19 1230 12/03/19 1300 12/03/19 1330  BP: (!) 158/88 (!) 148/87 (!) 153/81 (!) 167/88  Pulse: 67 70 65 65  Resp: 12 12 14 14   Temp:      TempSrc:      SpO2: 100% 99% 100% 97%   Eyes: PERRL, lids and conjunctivae normal ENMT: Mucous membranes are moist. Posterior pharynx clear of any exudate or lesions. Neck: normal, supple, no masses, no thyromegaly Respiratory: bilateral decreased breath sounds at bases, no wheezing, no crackles. Normal respiratory effort. No accessory muscle use.  Cardiovascular: S1 S2 positive, rate controlled. No extremity  edema. 2+ pedal pulses.  Mild right chest wall tenderness present. Abdomen: no tenderness, no masses palpated. No hepatosplenomegaly. Bowel sounds positive.  Musculoskeletal: no clubbing / cyanosis. No joint deformity upper and lower extremities.  Skin: no rashes, lesions, ulcers. No induration Neurologic: CN 2-12 grossly intact. Moving extremities. No focal neurologic deficits.  Psychiatric: Looks anxious.   Labs on Admission: I have personally reviewed following labs and imaging studies  CBC: Recent Labs  Lab 12/03/19 1050  WBC 7.0  HGB 14.5  HCT 44.1  MCV 83.5  PLT 306   Basic Metabolic Panel: Recent Labs  Lab 12/03/19 1050 12/03/19 1051  NA 136  --   K 3.9  --   CL 97*  --   CO2 26  --   GLUCOSE 133*  --   BUN 17  --   CREATININE  1.05*  --   CALCIUM 9.6  --   MG  --  1.5*   GFR: CrCl cannot be calculated (Unknown ideal weight.). Liver Function Tests: Recent Labs  Lab 12/03/19 1050  AST 38  ALT 25  ALKPHOS 66  BILITOT 0.6  PROT 8.1  ALBUMIN 4.5   No results for input(s): LIPASE, AMYLASE in the last 168 hours. No results for input(s): AMMONIA in the last 168 hours. Coagulation Profile: No results for input(s): INR, PROTIME in the last 168 hours. Cardiac Enzymes: No results for input(s): CKTOTAL, CKMB, CKMBINDEX, TROPONINI in the last 168 hours. BNP (last 3 results) No results for input(s): PROBNP in the last 8760 hours. HbA1C: No results for input(s): HGBA1C in the last 72 hours. CBG: No results for input(s): GLUCAP in the last 168 hours. Lipid Profile: No results for input(s): CHOL, HDL, LDLCALC, TRIG, CHOLHDL, LDLDIRECT in the last 72 hours. Thyroid Function Tests: No results for input(s): TSH, T4TOTAL, FREET4, T3FREE, THYROIDAB in the last 72 hours. Anemia Panel: No results for input(s): VITAMINB12, FOLATE, FERRITIN, TIBC, IRON, RETICCTPCT in the last 72 hours. Urine analysis:    Component Value Date/Time   COLORURINE STRAW (A) 12/04/2018 1805    APPEARANCEUR CLEAR 12/04/2018 1805   LABSPEC 1.008 12/04/2018 1805   PHURINE 8.0 12/04/2018 1805   GLUCOSEU NEGATIVE 12/04/2018 1805   HGBUR NEGATIVE 12/04/2018 1805   BILIRUBINUR NEGATIVE 12/04/2018 1805   KETONESUR NEGATIVE 12/04/2018 1805   PROTEINUR NEGATIVE 12/04/2018 1805   NITRITE NEGATIVE 12/04/2018 1805   LEUKOCYTESUR NEGATIVE 12/04/2018 1805    Radiological Exams on Admission: DG Chest Port 1 View  Result Date: 12/03/2019 CLINICAL DATA:  Dizziness.  Chest pain today. EXAM: PORTABLE CHEST 1 VIEW COMPARISON:  06/08/2016 FINDINGS: Cardiac silhouette is normal in size. No mediastinal or hilar masses. No evidence of adenopathy. Lungs are hyperexpanded, but clear. No pleural effusion or pneumothorax. Skeletal structures are grossly intact. IMPRESSION: No active disease. Electronically Signed   By: Amie Portland M.D.   On: 12/03/2019 11:39    EKG: Independently reviewed.  Sinus rhythm with intermittent PVCs.  No ST elevations or depressions.  Assessment/Plan  Chest pain Rule out unstable angina -Patient does not have known coronary artery disease but is on oral Coreg at home for possible PVCs in the past. -Presented with chest pain pressure-like.  Initial troponin is negative.  Cycle troponins.  Initial EKG was unremarkable. -2D echo -Continue Coreg along with oral aspirin.  Start Lipitor 80 mg daily.  Follow lipid profile in the a.m. -Spoke to Dr. Patwardhan/cardiology who will also evaluate the patient in consultation  Hypertension -Monitor blood pressure.  Continue home regimen including Coreg, amlodipine, losartan and hydrochlorothiazide  Diabetes mellitus type 2 -Hold Metformin.  Check hemoglobin A1c.  CBGs with SSI  Hypothyroidism -Continue Synthroid  Anxiety/depression -Continue BuSpar.  Continue trazodone at night as needed  DVT prophylaxis: Lovenox Code Status: Full Family Communication: Spoke to patient and niece at bedside Disposition Plan: Home in 1 to 2  days once chest pain improved and cleared by cardiology Consults called: Cardiology/Dr. Rosemary Holms Admission status: Observation/telemetry  Severity of Illness: The appropriate patient status for this patient is OBSERVATION. Observation status is judged to be reasonable and necessary in order to provide the required intensity of service to ensure the patient's safety. The patient's presenting symptoms, physical exam findings, and initial radiographic and laboratory data in the context of their medical condition is felt to place them at decreased risk for further clinical deterioration.  Furthermore, it is anticipated that the patient will be medically stable for discharge from the hospital within 2 midnights of admission. The following factors support the patient status of observation.   " The patient's presenting symptoms include Chest pain. " The physical exam findings include chest wall tenderness/anxious looking " The initial radiographic and laboratory data are unremarkable.      Glade Lloyd MD [facility]  12/03/2019, 2:01 PM

## 2019-12-03 NOTE — ED Notes (Signed)
Patient given one nitro, patient now reporting heaviness is gone and so is tingling.

## 2019-12-03 NOTE — ED Notes (Signed)
Stacy Nash, EDP at bedside.    Patient reports frequent urination.

## 2019-12-03 NOTE — ED Provider Notes (Signed)
[facility] DEPT Provider Note   CSN: [id number] Arrival date & time: 12/03/19  1034     History Chief Complaint  Patient presents with  . Chest Pain    Stacy Nash is a 69 y.o. female.  She said today that she felt some substernal chest pressure rating it as 8 out of 10 starting around 7 AM.  It was associated with feeling dizzy and generally weak.  She checked her blood pressure and it was elevated for her.  She is taken her regular morning medication.  Has a history of H. pylori and supposed to be getting another endoscopy soon.  She said she has never had this chest pain before.  Feels rather anxious.   Chest Pain Associated symptoms: dizziness   Associated symptoms: no abdominal pain, no fever, no headache and no shortness of breath     HPI: A 69 year old patient with a history of treated diabetes, hypertension and hypercholesterolemia presents for evaluation of chest pain. Initial onset of pain was approximately 3-6 hours ago. The patient's chest pain is described as heaviness/pressure/tightness and is not worse with exertion. The patient's chest pain is middle- or left-sided, is not well-localized, is not sharp and does not radiate to the arms/jaw/neck. The patient does not complain of nausea and denies diaphoresis. The patient has no history of stroke, has no history of peripheral artery disease, has not smoked in the past 90 days, has no relevant family history of coronary artery disease (first degree relative at less than age 27) and does not have an elevated BMI (>=30).   Past Medical History:  Diagnosis Date  . Diabetes mellitus without complication (HCC)    Type 2  . Graves disease   . Hypertension   . Thyroid disease     There are no problems to display for this patient.   Past Surgical History:  Procedure Laterality Date  . VAGINAL HYSTERECTOMY       OB History   No obstetric history on file.     No family history on  file.  Social History   Tobacco Use  . Smoking status: Never Smoker  . Smokeless tobacco: Never Used  Substance Use Topics  . Alcohol use: No  . Drug use: Not on file    Home Medications Prior to Admission medications   Medication Sig Start Date End Date Taking? Authorizing Provider  amLODipine (NORVASC) 10 MG tablet Take 10 mg by mouth at bedtime.     [provider]  aspirin EC 81 MG tablet Take 81 mg by mouth daily.    [provider]  atorvastatin (LIPITOR) 40 MG tablet Take 40 mg by mouth every other day. AT BEDTIME    [provider]  busPIRone (BUSPAR) 10 MG tablet Take 10 mg by mouth daily.     [provider]  carboxymethylcellulose (REFRESH PLUS) 0.5 % SOLN Place 1 drop into both eyes daily as needed (dry eyes).     [provider]  carvedilol (COREG) 25 MG tablet Take 25 mg by mouth 2 (two) times daily.     [provider]  cloNIDine (CATAPRES) 0.1 MG tablet Take 0.1 mg by mouth daily as needed (bp >170/90).    [provider]  diclofenac sodium (VOLTAREN) 1 % GEL Apply 1 application topically daily as needed (arthritis pain).    [provider]  famotidine (PEPCID) 20 MG tablet Take 1 tablet (20 mg total) by mouth 2 (two) times daily. Patient  not taking: Reported on 12/29/2018 07/23/18   Pricilla Loveless, MD  gabapentin (NEURONTIN) 100 MG capsule Take 200 mg by mouth 2 (two) times daily as needed (for Restless Legs Syndrome).     [provider]  hydrochlorothiazide (HYDRODIURIL) 50 MG tablet Take 50 mg by mouth daily.    [provider]  ibuprofen (ADVIL) 800 MG tablet Take 800 mg by mouth every 8 (eight) hours as needed (arthritis pain).    [provider]  levothyroxine (SYNTHROID, LEVOTHROID) 88 MCG tablet Take 1 tablet (88 mcg total) by mouth daily before breakfast. 07/21/17   Petrucelli, Samantha R, PA-C  losartan (COZAAR) 100 MG tablet Take 100 mg by mouth daily.     [provider]  magnesium oxide (MAG-OX) 400 MG tablet Take 800 mg by mouth daily.    [provider]  metFORMIN (GLUCOPHAGE) 500 MG tablet Take 500 mg by mouth See admin instructions. Take 500 mg by mouth in the morning before breakfast and an additional 500 mg at bedtime if needed for CBG >170    [provider]  mupirocin cream (BACTROBAN) 2 % Apply to affected area 3 times daily Patient taking differently: Apply 1 application topically 3 (three) times daily. Until burn is healed 12/17/18 12/17/19  Elson Areas, PA-C  omeprazole (PRILOSEC) 40 MG capsule Take 40 mg by mouth daily.    [provider]  ondansetron (ZOFRAN ODT) 4 MG disintegrating tablet Take 1 tablet (4 mg total) by mouth every 8 (eight) hours as needed for nausea or vomiting. Patient not taking: Reported on 12/29/2018 04/29/18   Terrilee Files, MD  pantoprazole (PROTONIX) 40 MG tablet Take 1 tablet (40 mg total) by mouth daily. Patient not taking: Reported on 12/29/2018 07/23/18   Pricilla Loveless, MD  potassium chloride 20 MEQ/15ML (10%) SOLN Take 40 mEq by mouth 2 (two) times daily.     [provider]  traZODone (DESYREL) 50 MG tablet Take 25 mg by mouth at bedtime as needed (sleep).     [provider]    Allergies    Keflex [cephalexin] and Sudafed [pseudoephedrine hcl]  Review of Systems   Review of Systems  Constitutional: Negative for fever.  HENT: Negative for sore throat.   Eyes: Negative for visual disturbance.  Respiratory: Negative for shortness of breath.   Cardiovascular: Positive for chest pain.  Gastrointestinal: Negative for abdominal pain.  Genitourinary: Negative for dysuria.  Musculoskeletal: Negative for neck pain.  Skin: Negative for rash.  Neurological: Positive for dizziness. Negative for headaches.    Physical Exam Updated Vital Signs BP (!) 180/112 (BP Location: Left Arm)   Pulse 90   Temp 98.2 F (36.8 C) (Oral)   Resp 19   SpO2  100%   Physical Exam Vitals and nursing note reviewed.  Constitutional:      General: She is not in acute distress.    Appearance: She is well-developed.  HENT:     Head: Normocephalic and atraumatic.  Eyes:     Conjunctiva/sclera: Conjunctivae normal.  Cardiovascular:     Rate and Rhythm: Normal rate and regular rhythm.     Heart sounds: Normal heart sounds. No murmur.  Pulmonary:     Effort: Pulmonary effort is normal. No respiratory distress.     Breath sounds: Normal breath sounds.  Abdominal:     Palpations: Abdomen is soft.     Tenderness: There is no abdominal tenderness.  Musculoskeletal:        General: Normal  range of motion.     Cervical back: Neck supple.     Right lower leg: No edema.     Left lower leg: No edema.  Skin:    General: Skin is warm and dry.     Capillary Refill: Capillary refill takes less than 2 seconds.  Neurological:     General: No focal deficit present.     Mental Status: She is alert.     ED Results / Procedures / Treatments   Labs (all labs ordered are listed, but only abnormal results are displayed) Labs Reviewed  BASIC METABOLIC PANEL - Abnormal; Notable for the following components:      Result Value   Chloride 97 (*)    Glucose, Bld 133 (*)    Creatinine, Ser 1.05 (*)    GFR calc non Af Amer 55 (*)    All other components within normal limits  CBC - Abnormal; Notable for the following components:   RBC 5.28 (*)    All other components within normal limits  MAGNESIUM - Abnormal; Notable for the following components:   Magnesium 1.5 (*)    All other components within normal limits  SARS CORONAVIRUS 2 (TAT 6-24 HRS)  HEPATIC FUNCTION PANEL  GLUCOSE, CAPILLARY  HEMOGLOBIN A1C  HIV ANTIBODY (ROUTINE TESTING W REFLEX)  LIPID PANEL  CBC WITH DIFFERENTIAL/PLATELET  MAGNESIUM  BASIC METABOLIC PANEL  TROPONIN I (HIGH SENSITIVITY)  TROPONIN I (HIGH SENSITIVITY)    EKG EKG Interpretation  Date/Time:  Wednesday December 03 2019  10:42:17 EDT Ventricular Rate:  76 PR Interval:    QRS Duration: 90 QT Interval:  415 QTC Calculation: 467 R Axis:   -3 Text Interpretation: Sinus rhythm Paired ventricular premature complexes Probable left atrial enlargement ectopy new from prior 5/20 Confirmed by Meridee Score [phone]) on 12/03/2019 10:48:18 AM   Radiology DG Chest Port 1 View  Result Date: 12/03/2019 CLINICAL DATA:  Dizziness.  Chest pain today. EXAM: PORTABLE CHEST 1 VIEW COMPARISON:  06/08/2016 FINDINGS: Cardiac silhouette is normal in size. No mediastinal or hilar masses. No evidence of adenopathy. Lungs are hyperexpanded, but clear. No pleural effusion or pneumothorax. Skeletal structures are grossly intact. IMPRESSION: No active disease. Electronically Signed   By: Amie Portland M.D.   On: 12/03/2019 11:39    Procedures Procedures (including critical care time)  Medications Ordered in ED Medications  nitroGLYCERIN (NITROSTAT) SL tablet 0.4 mg (0.4 mg Sublingual Given 12/03/19 1134)  pantoprazole (PROTONIX) EC tablet 40 mg (has no administration in time range)  amLODipine (NORVASC) tablet 10 mg (has no administration in time range)  carvedilol (COREG) tablet 25 mg (has no administration in time range)  hydrochlorothiazide (HYDRODIURIL) tablet 50 mg (has no administration in time range)  losartan (COZAAR) tablet 100 mg (has no administration in time range)  busPIRone (BUSPAR) tablet 10 mg (has no administration in time range)  traZODone (DESYREL) tablet 25 mg (has no administration in time range)  levothyroxine (SYNTHROID) tablet 88 mcg (has no administration in time range)  magnesium oxide (MAG-OX) tablet 800 mg (has no administration in time range)  gabapentin (NEURONTIN) capsule 200 mg (has no administration in time range)  polyvinyl alcohol (LIQUIFILM TEARS) 1.4 % ophthalmic solution 1 drop (has no administration in time range)  lidocaine (LIDODERM) 5 % 1 patch (has no administration in time range)   fluticasone (FLONASE) 50 MCG/ACT nasal spray 1-2 spray (has no administration in time range)  sodium chloride (OCEAN) 0.65 % nasal spray 1 spray (has no administration  in time range)  acetaminophen (TYLENOL) tablet 650 mg (has no administration in time range)  ondansetron (ZOFRAN) injection 4 mg (has no administration in time range)  insulin aspart (novoLOG) injection 0-5 Units (has no administration in time range)  insulin aspart (novoLOG) injection 0-9 Units (has no administration in time range)  enoxaparin (LOVENOX) injection 40 mg (has no administration in time range)  alum & mag hydroxide-simeth (MAALOX/MYLANTA) 200-200-20 MG/5ML suspension 30 mL (has no administration in time range)  aspirin EC tablet 81 mg (has no administration in time range)  atorvastatin (LIPITOR) tablet 80 mg (has no administration in time range)  sodium chloride flush (NS) 0.9 % injection 3 mL (3 mLs Intravenous Given 12/03/19 1052)  aspirin chewable tablet 324 mg (324 mg Oral Given 12/03/19 1133)  magnesium sulfate IVPB 2 g 50 mL ( Intravenous Stopped 12/03/19 1251)    ED Course  I have reviewed the triage vital signs and the nursing notes.  Pertinent labs & imaging results that were available during my care of the patient were reviewed by me and considered in my medical decision making (see chart for details).  Clinical Course as of Dec 02 1720  Wed Dec 03, 2019  1142 Cxr interpreted by me as no pneumonia, ptx.    [MB]  1335 Patient still having some discomfort and says she just does not feel safe going home.  Magnesium is infused and hand she is having less ectopy.  Initial troponin II although has a reasonable heart score of 6.  Discussed with Dr. Hanley Ben from [facility] hospitalist will evaluate patient for observation.   [MB]    Clinical Course User Index [MB] Terrilee Files, MD   MDM Rules/Calculators/A&P HEAR Score: 6                   This patient complains of chest pressure; this involves an extensive  number of treatment Options and is a complaint that carries with it a high risk of complications and Morbidity. The differential includes ACS, reflux, pneumonia, pneumothorax, vascular, PE, referred abdominal pain, anemia, metabolic derangement  I ordered, reviewed and interpreted labs, which included normal white blood cell count normal hemoglobin.  Creatinine stable at 1.05.  Electrolytes within normal limits.  Initial troponin 2, unremarkable.  Magnesium low at 1.5, potential cause of her increased PVCs. I ordered medication aspirin magnesium nitroglycerin with improvement in her PVCs and her hypertension I ordered imaging studies which included chest x-ray and I independently    visualized and interpreted imaging which showed no infiltrates or pneumothorax Additional history obtained from daughter Previous records obtained and reviewed in epic I consulted [facility] hospitalist Dr.Alekh and discussed lab and imaging findings   After the interventions stated above, I reevaluated the patient and found the patient to be improved although not pain-free.  She still seems very anxious returning home.  Discussed with [facility] hospitalist with her elevated heart score she may benefit from observation for blood pressure monitoring and serial troponins.  Final Clinical Impression(s) / ED Diagnoses Final diagnoses:  Chest pain, unspecified type  Hypertensive urgency  Dizziness and giddiness    Rx / DC Orders ED Discharge Orders    None       Terrilee Files, MD 12/03/19 1726

## 2019-12-04 ENCOUNTER — Observation Stay (HOSPITAL_BASED_OUTPATIENT_CLINIC_OR_DEPARTMENT_OTHER): Payer: No Typology Code available for payment source

## 2019-12-04 ENCOUNTER — Other Ambulatory Visit (HOSPITAL_COMMUNITY): Payer: Medicare Other

## 2019-12-04 DIAGNOSIS — R079 Chest pain, unspecified: Secondary | ICD-10-CM | POA: Diagnosis not present

## 2019-12-04 DIAGNOSIS — E039 Hypothyroidism, unspecified: Secondary | ICD-10-CM | POA: Diagnosis not present

## 2019-12-04 DIAGNOSIS — I493 Ventricular premature depolarization: Secondary | ICD-10-CM

## 2019-12-04 DIAGNOSIS — I10 Essential (primary) hypertension: Secondary | ICD-10-CM

## 2019-12-04 DIAGNOSIS — R072 Precordial pain: Secondary | ICD-10-CM | POA: Diagnosis not present

## 2019-12-04 LAB — CBC WITH DIFFERENTIAL/PLATELET
Abs Immature Granulocytes: 0.02 10*3/uL (ref 0.00–0.07)
Basophils Absolute: 0.1 10*3/uL (ref 0.0–0.1)
Basophils Relative: 1 %
Eosinophils Absolute: 0.1 10*3/uL (ref 0.0–0.5)
Eosinophils Relative: 1 %
HCT: 40.1 % (ref 36.0–46.0)
Hemoglobin: 12.7 g/dL (ref 12.0–15.0)
Immature Granulocytes: 0 %
Lymphocytes Relative: 23 %
Lymphs Abs: 1.3 10*3/uL (ref 0.7–4.0)
MCH: 26.7 pg (ref 26.0–34.0)
MCHC: 31.7 g/dL (ref 30.0–36.0)
MCV: 84.4 fL (ref 80.0–100.0)
Monocytes Absolute: 0.4 10*3/uL (ref 0.1–1.0)
Monocytes Relative: 8 %
Neutro Abs: 3.6 10*3/uL (ref 1.7–7.7)
Neutrophils Relative %: 67 %
Platelets: 252 10*3/uL (ref 150–400)
RBC: 4.75 MIL/uL (ref 3.87–5.11)
RDW: 14.8 % (ref 11.5–15.5)
WBC: 5.4 10*3/uL (ref 4.0–10.5)
nRBC: 0 % (ref 0.0–0.2)

## 2019-12-04 LAB — BASIC METABOLIC PANEL
Anion gap: 13 (ref 5–15)
BUN: 18 mg/dL (ref 8–23)
CO2: 26 mmol/L (ref 22–32)
Calcium: 9.2 mg/dL (ref 8.9–10.3)
Chloride: 99 mmol/L (ref 98–111)
Creatinine, Ser: 0.93 mg/dL (ref 0.44–1.00)
GFR calc Af Amer: >60 mL/min (ref >60)
GFR calc non Af Amer: >60 mL/min (ref >60)
Glucose, Bld: 148 mg/dL — ABNORMAL HIGH (ref 70–99)
Potassium: 3.3 mmol/L — ABNORMAL LOW (ref 3.5–5.1)
Sodium: 138 mmol/L (ref 135–145)

## 2019-12-04 LAB — GLUCOSE, CAPILLARY
Glucose-Capillary: 100 mg/dL — ABNORMAL HIGH (ref 70–99)
Glucose-Capillary: 156 mg/dL — ABNORMAL HIGH (ref 70–99)

## 2019-12-04 LAB — HIV ANTIBODY (ROUTINE TESTING W REFLEX): HIV Screen 4th Generation wRfx: NONREACTIVE

## 2019-12-04 LAB — ECHOCARDIOGRAM COMPLETE
Height: 65 in
Weight: 2800 oz

## 2019-12-04 LAB — LIPID PANEL
Cholesterol: 188 mg/dL (ref 0–200)
HDL: 53 mg/dL (ref >40)
LDL Cholesterol: 123 mg/dL — ABNORMAL HIGH (ref 0–99)
Total CHOL/HDL Ratio: 3.5 RATIO
Triglycerides: 60 mg/dL (ref <150)
VLDL: 12 mg/dL (ref 0–40)

## 2019-12-04 LAB — MAGNESIUM: Magnesium: 1.9 mg/dL (ref 1.7–2.4)

## 2019-12-04 LAB — HEMOGLOBIN A1C
Hgb A1c MFr Bld: 6.3 % — ABNORMAL HIGH (ref 4.8–5.6)
Mean Plasma Glucose: 134.11 mg/dL

## 2019-12-04 MED ORDER — POTASSIUM CHLORIDE CRYS ER 20 MEQ PO TBCR
40.0000 meq | EXTENDED_RELEASE_TABLET | Freq: Once | ORAL | Status: AC
  Administered 2019-12-04: 40 meq via ORAL
  Filled 2019-12-04: qty 2

## 2019-12-04 NOTE — Discharge Summary (Signed)
Physician Discharge Summary  Stacy Nash LXB:[id number] DOB: Jan 08, 1951 DOA: 12/03/2019  PCP: Greta Doom, MD  Admit date: 12/03/2019 Discharge date: 12/04/2019  Admitted From: Home  Discharge disposition: Home   Recommendations for Outpatient Follow-Up:   . Follow up with your primary care provider in one week.  . Check CBC, BMP in the next visit.   Discharge Diagnosis:   Principal Problem:   Chest pain Active Problems:   Benign essential HTN   Hypothyroidism   Discharge Condition: Improved.  Diet recommendation: Low sodium, heart healthy.     Wound care: None.  Code status: Full.   History of Present Illness:   Stacy Nash is a 69 y.o. female with medical history significant of hypertension, diabetes mellitus type 2, hypothyroidism, anxiety/depression presented to the hospital with with chest pain.  Patient stated that she had sudden onset of mostly substernal chest pain, pressure like in nature, 7-8 out of 10 in intensity, radiating to the left shoulder with no aggravating or relieving factors associated with mild shortness of breath.  Patient has had first dose of Covid vaccine and is due for her second dose. ED Course: First high-sensitivity troponin was 2.  EKG was unremarkable.  Patient continued to have chest pressure.  Hospitalist service was called to evaluate the patient.   Hospital Course:   Following conditions were addressed during hospitalization as listed below,  Chest pain Acute coronary syndrome ruled out.  EKG was unremarkable.  Possible chest pain from from PVCs.  History of coronary artery disease.  On Coreg for PVCs in the past.  Troponins were negative.  2D echocardiogram was unremarkable.  Patient was seen by cardiology.  No further recommendations were given.  Patient does have a cardiologist after [facility] and will follow up with them for possible stress test in the future.  Was advised to continue Coreg.  Essential  hypertension Continue Coreg, amlodipine, losartan and hydrochlorothiazide on discharge.  Diabetes mellitus type 2 hemoglobin A1c of 6.3.  Continue diabetic diet, Metformin at home  Hypothyroidism -Continue Synthroid  Anxiety/depression -Continue BuSpar.  Continue trazodone at night as needed   Disposition.  At this time, patient is stable for disposition home.  Patient will have to follow-up with her primary care physician and cardiology as outpatient.  Medical Consultants:    Cardiology  Procedures:    2 d Echocardiogram Subjective:   Today, patient patient denies any chest pain or shortness of breath at the time of my evaluation.  Discharge Exam:   Vitals:   12/04/19 0518 12/04/19 1304  BP: 140/75 (!) 110/59  Pulse: (!) 58 61  Resp: 16 16  Temp: 98.4 F (36.9 C) 98.2 F (36.8 C)  SpO2: 98% 97%   Vitals:   12/03/19 2014 12/04/19 0233 12/04/19 0518 12/04/19 1304  BP: 131/81 126/72 140/75 (!) 110/59  Pulse: 62 66 (!) 58 61  Resp: 18 18 16 16   Temp: 98.1 F (36.7 C) 98 F (36.7 C) 98.4 F (36.9 C) 98.2 F (36.8 C)  TempSrc:  Oral Oral   SpO2: 99% 94% 98% 97%  Weight:      Height:        General: Alert awake, not in obvious distress, mildly anxious HENT: pupils equally reacting to light,  No scleral pallor or icterus noted. Oral mucosa is moist.  Chest:  Clear breath sounds.  Diminished breath sounds bilaterally. No crackles or wheezes.  CVS: S1 &S2 heard. No murmur.  Regular rate and rhythm. Abdomen: Soft,  nontender, nondistended.  Bowel sounds are heard.   Extremities: No cyanosis, clubbing or edema.  Peripheral pulses are palpable. Psych: Alert, awake and oriented, normal mood CNS:  No cranial nerve deficits.  Power equal in all extremities.   Skin: Warm and dry.  No rashes noted.  The results of significant diagnostics from this hospitalization (including imaging, microbiology, ancillary and laboratory) are listed below for reference.      Diagnostic Studies:   DG Chest Port 1 View  Result Date: 12/03/2019 CLINICAL DATA:  Dizziness.  Chest pain today. EXAM: PORTABLE CHEST 1 VIEW COMPARISON:  06/08/2016 FINDINGS: Cardiac silhouette is normal in size. No mediastinal or hilar masses. No evidence of adenopathy. Lungs are hyperexpanded, but clear. No pleural effusion or pneumothorax. Skeletal structures are grossly intact. IMPRESSION: No active disease. Electronically Signed   By: Amie Portlandavid  Ormond M.D.   On: 12/03/2019 11:39   ECHOCARDIOGRAM COMPLETE  Result Date: 12/04/2019    ECHOCARDIOGRAM REPORT   Patient Name:   Stacy SizerARLINE Nash Date of Exam: 12/04/2019 Medical Rec #:  [id number]     Height:       65.0 in Accession #:    [id number]    Weight:       175.0 lb Date of Birth:  09/23/1950      BSA:          1.869 m Patient Age:    68 years      BP:           140/75 mmHg Patient Gender: F             HR:           58 bpm. Exam Location:  Inpatient Procedure: 2D Echo Indications:    Chest Pain 786.50 / R07.9  History:        Patient has no prior history of Echocardiogram examinations.                 Risk Factors:Hypertension.  Sonographer:    Leeroy Bockhelsea Turrentine Referring Phys: [id number] [facility] Jahmya Onofrio IMPRESSIONS  1. Left ventricular ejection fraction, by estimation, is 60 to 65%. The left ventricle has normal function. The left ventricle has no regional wall motion abnormalities. There is mild left ventricular hypertrophy. Left ventricular diastolic parameters are consistent with Grade I diastolic dysfunction (impaired relaxation).  2. Right ventricular systolic function is normal. The right ventricular size is normal. There is normal pulmonary artery systolic pressure. The estimated right ventricular systolic pressure is 19.2 mmHg.  3. The mitral valve is normal in structure. Trivial mitral valve regurgitation. No evidence of mitral stenosis.  4. The aortic valve is tricuspid. Aortic valve regurgitation is not visualized. Mild aortic valve sclerosis is  present, with no evidence of aortic valve stenosis.  5. The inferior vena cava is normal in size with greater than 50% respiratory variability, suggesting right atrial pressure of 3 mmHg. FINDINGS  Left Ventricle: Left ventricular ejection fraction, by estimation, is 60 to 65%. The left ventricle has normal function. The left ventricle has no regional wall motion abnormalities. The left ventricular internal cavity size was normal in size. There is  mild left ventricular hypertrophy. Left ventricular diastolic parameters are consistent with Grade I diastolic dysfunction (impaired relaxation). Right Ventricle: The right ventricular size is normal. No increase in right ventricular wall thickness. Right ventricular systolic function is normal. There is normal pulmonary artery systolic pressure. The tricuspid regurgitant velocity is 2.01 m/s, and  with an assumed right atrial pressure of 3  mmHg, the estimated right ventricular systolic pressure is 19.2 mmHg. Left Atrium: Left atrial size was normal in size. Right Atrium: Right atrial size was normal in size. Pericardium: There is no evidence of pericardial effusion. Mitral Valve: The mitral valve is normal in structure. There is mild calcification of the mitral valve leaflet(s). Trivial mitral valve regurgitation. No evidence of mitral valve stenosis. Tricuspid Valve: The tricuspid valve is normal in structure. Tricuspid valve regurgitation is trivial. Aortic Valve: The aortic valve is tricuspid. Aortic valve regurgitation is not visualized. Mild aortic valve sclerosis is present, with no evidence of aortic valve stenosis. Pulmonic Valve: The pulmonic valve was normal in structure. Pulmonic valve regurgitation is not visualized. Aorta: The aortic root is normal in size and structure. Venous: The inferior vena cava is normal in size with greater than 50% respiratory variability, suggesting right atrial pressure of 3 mmHg. IAS/Shunts: No atrial level shunt detected by  color flow Doppler.  LEFT VENTRICLE PLAX 2D LVIDd:         4.07 cm  Diastology LVIDs:         2.57 cm  LV e' lateral:   8.49 cm/s LV PW:         0.89 cm  LV E/e' lateral: 7.7 LV IVS:        0.80 cm  LV e' medial:    5.77 cm/s LVOT diam:     1.90 cm  LV E/e' medial:  11.3 LV SV:         58 LV SV Index:   31 LVOT Area:     2.84 cm  RIGHT VENTRICLE RV S prime:     12.00 cm/s TAPSE (M-mode): 1.9 cm LEFT ATRIUM             Index       RIGHT ATRIUM           Index LA diam:        4.00 cm 2.14 cm/m  RA Area:     14.20 cm LA Vol (A2C):   35.7 ml 19.10 ml/m RA Volume:   34.80 ml  18.62 ml/m LA Vol (A4C):   53.3 ml 28.52 ml/m LA Biplane Vol: 47.1 ml 25.20 ml/m  AORTIC VALVE LVOT Vmax:   99.00 cm/s LVOT Vmean:  73.300 cm/s LVOT VTI:    0.206 m  AORTA Ao Root diam: 2.60 cm MITRAL VALVE               TRICUSPID VALVE MV Area (PHT): 2.13 cm    TR Peak grad:   16.2 mmHg MV Decel Time: 356 msec    TR Vmax:        201.00 cm/s MV E velocity: 65.10 cm/s MV A velocity: 88.60 cm/s  SHUNTS MV E/A ratio:  0.73        Systemic VTI:  0.21 m                            Systemic Diam: 1.90 cm Marca Ancona MD Electronically signed by Marca Ancona MD Signature Date/Time: 12/04/2019/2:57:05 PM    Final      Labs:   Basic Metabolic Panel: Recent Labs  Lab 12/03/19 1050 12/03/19 1051 12/04/19 0359  NA 136  --  138  K 3.9  --  3.3*  CL 97*  --  99  CO2 26  --  26  GLUCOSE 133*  --  148*  BUN 17  --  18  CREATININE 1.05*  --  0.93  CALCIUM 9.6  --  9.2  MG  --  1.5* 1.9   GFR Estimated Creatinine Clearance: 61.2 mL/min (by C-G formula based on SCr of 0.93 mg/dL). Liver Function Tests: Recent Labs  Lab 12/03/19 1050  AST 38  ALT 25  ALKPHOS 66  BILITOT 0.6  PROT 8.1  ALBUMIN 4.5   No results for input(s): LIPASE, AMYLASE in the last 168 hours. No results for input(s): AMMONIA in the last 168 hours. Coagulation profile No results for input(s): INR, PROTIME in the last 168 hours.  CBC: Recent Labs  Lab  12/03/19 1050 12/04/19 0359  WBC 7.0 5.4  NEUTROABS  --  3.6  HGB 14.5 12.7  HCT 44.1 40.1  MCV 83.5 84.4  PLT 306 252   Cardiac Enzymes: No results for input(s): CKTOTAL, CKMB, CKMBINDEX, TROPONINI in the last 168 hours. BNP: Invalid input(s): POCBNP CBG: Recent Labs  Lab 12/03/19 1538 12/03/19 2213 12/04/19 0731 12/04/19 1206  GLUCAP 82 123* 100* 156*   D-Dimer No results for input(s): DDIMER in the last 72 hours. Hgb A1c Recent Labs    12/04/19 0359  HGBA1C 6.3*   Lipid Profile Recent Labs    12/04/19 0359  CHOL 188  HDL 53  LDLCALC 123*  TRIG 60  CHOLHDL 3.5   Thyroid function studies No results for input(s): TSH, T4TOTAL, T3FREE, THYROIDAB in the last 72 hours.  Invalid input(s): FREET3 Anemia work up No results for input(s): VITAMINB12, FOLATE, FERRITIN, TIBC, IRON, RETICCTPCT in the last 72 hours. Microbiology Recent Results (from the past 240 hour(s))  SARS CORONAVIRUS 2 (TAT 6-24 HRS) Nasopharyngeal Nasopharyngeal Swab     Status: None   Collection Time: 12/03/19  1:55 PM   Specimen: Nasopharyngeal Swab  Result Value Ref Range Status   SARS Coronavirus 2 NEGATIVE NEGATIVE Final    Comment: (NOTE) SARS-CoV-2 target nucleic acids are NOT DETECTED. The SARS-CoV-2 RNA is generally detectable in upper and lower respiratory specimens during the acute phase of infection. Negative results do not preclude SARS-CoV-2 infection, do not rule out co-infections with other pathogens, and should not be used as the sole basis for treatment or other patient management decisions. Negative results must be combined with clinical observations, patient history, and epidemiological information. The expected result is Negative. Fact Sheet for Patients: SugarRoll.be Fact Sheet for Healthcare Providers: https://www.woods-mathews.com/ This test is not yet approved or cleared by the [place] FDA and  has been authorized for  detection and/or diagnosis of SARS-CoV-2 by FDA under an Emergency Use Authorization (EUA). This EUA will remain  in effect (meaning this test can be used) for the duration of the COVID-19 declaration under Section 56 4(b)(1) of the Act, 21 U.S.C. section 360bbb-3(b)(1), unless the authorization is terminated or revoked sooner. Performed at [facility], [place] [address], [place],  [address]      Discharge Instructions:   Discharge Instructions    Diet - low sodium heart healthy   Complete by: As directed    Diabetic diet   Discharge instructions   Complete by: As directed    Follow up with your primary care provider as scheduled by you. Follow up with your cardiologist in 1-2 weeks.   Increase activity slowly   Complete by: As directed      Allergies as of 12/04/2019      Reactions   Keflex [cephalexin] Palpitations   Sudafed [pseudoephedrine Hcl] Palpitations  Medication List    STOP taking these medications   famotidine 20 MG tablet Commonly known as: PEPCID   mupirocin cream 2 % Commonly known as: Bactroban   ondansetron 4 MG disintegrating tablet Commonly known as: Zofran ODT   pantoprazole 40 MG tablet Commonly known as: Protonix     TAKE these medications   amLODipine 10 MG tablet Commonly known as: NORVASC Take 10 mg by mouth at bedtime.   busPIRone 10 MG tablet Commonly known as: BUSPAR Take 10 mg by mouth daily.   carboxymethylcellulose 0.5 % Soln Commonly known as: REFRESH PLUS Place 1 drop into both eyes daily as needed (dry eyes).   carvedilol 25 MG tablet Commonly known as: COREG Take 25 mg by mouth 2 (two) times daily.   diclofenac sodium 1 % Gel Commonly known as: VOLTAREN Apply 1 application topically daily as needed (arthritis pain).   fluticasone 50 MCG/ACT nasal spray Commonly known as: FLONASE Place 1-2 sprays into both nostrils daily as needed for allergies or rhinitis.   gabapentin 100 MG capsule Commonly  known as: NEURONTIN Take 200 mg by mouth 2 (two) times daily as needed (for Restless Legs Syndrome).   hydrochlorothiazide 50 MG tablet Commonly known as: HYDRODIURIL Take 50 mg by mouth daily.   levothyroxine 88 MCG tablet Commonly known as: SYNTHROID Take 1 tablet (88 mcg total) by mouth daily before breakfast.   lidocaine 5 % Commonly known as: LIDODERM Place 1 patch onto the skin daily as needed (back pain). Remove & Discard patch within 12 hours or as directed by MD   losartan 100 MG tablet Commonly known as: COZAAR Take 100 mg by mouth daily.   magnesium oxide 400 MG tablet Commonly known as: MAG-OX Take 800 mg by mouth daily.   metFORMIN 500 MG tablet Commonly known as: GLUCOPHAGE Take 500 mg by mouth See admin instructions. Take 500 mg by mouth in the morning before breakfast and an additional 500 mg at bedtime if needed for CBG >170   naproxen sodium 220 MG tablet Commonly known as: ALEVE Take 220 mg by mouth 2 (two) times daily as needed (headache/pain).   omeprazole 40 MG capsule Commonly known as: PRILOSEC Take 40 mg by mouth daily.   potassium chloride 20 MEQ/15ML (10%) Soln Take 30 mEq by mouth 2 (two) times daily.   sodium chloride 0.65 % Soln nasal spray Commonly known as: OCEAN Place 1 spray into both nostrils as needed for congestion.   traZODone 50 MG tablet Commonly known as: DESYREL Take 25 mg by mouth at bedtime as needed (sleep).         Time coordinating discharge: 39 minutes  Signed:  Vitaliy Eisenhour  [facility] 12/04/2019, 4:09 PM

## 2019-12-04 NOTE — Plan of Care (Signed)
Discharge instructions reviewed with patient, questions answered, verbalized understanding.  Patient transported via wheelchair to main entrance to be taken home by cab.

## 2019-12-04 NOTE — Progress Notes (Signed)
  Echocardiogram 2D Echocardiogram has been performed.  Stacy Nash Stacy Nash 12/04/2019, 11:15 AM

## 2019-12-11 ENCOUNTER — Emergency Department (HOSPITAL_COMMUNITY): Payer: No Typology Code available for payment source

## 2019-12-11 ENCOUNTER — Other Ambulatory Visit: Payer: Self-pay

## 2019-12-11 ENCOUNTER — Encounter (HOSPITAL_COMMUNITY): Payer: Self-pay | Admitting: Emergency Medicine

## 2019-12-11 ENCOUNTER — Emergency Department (HOSPITAL_COMMUNITY)
Admission: EM | Admit: 2019-12-11 | Discharge: 2019-12-11 | Disposition: A | Discharge status: Home / Self Care | Payer: No Typology Code available for payment source | Attending: Emergency Medicine | Admitting: Emergency Medicine

## 2019-12-11 DIAGNOSIS — I10 Essential (primary) hypertension: Secondary | ICD-10-CM | POA: Diagnosis not present

## 2019-12-11 DIAGNOSIS — Z7984 Long term (current) use of oral hypoglycemic drugs: Secondary | ICD-10-CM | POA: Insufficient documentation

## 2019-12-11 DIAGNOSIS — R002 Palpitations: Secondary | ICD-10-CM | POA: Diagnosis not present

## 2019-12-11 DIAGNOSIS — E119 Type 2 diabetes mellitus without complications: Secondary | ICD-10-CM | POA: Insufficient documentation

## 2019-12-11 DIAGNOSIS — Z79899 Other long term (current) drug therapy: Secondary | ICD-10-CM | POA: Insufficient documentation

## 2019-12-11 LAB — CBC
HCT: 39.7 % (ref 36.0–46.0)
Hemoglobin: 12.8 g/dL (ref 12.0–15.0)
MCH: 26.8 pg (ref 26.0–34.0)
MCHC: 32.2 g/dL (ref 30.0–36.0)
MCV: 83.1 fL (ref 80.0–100.0)
Platelets: 267 10*3/uL (ref 150–400)
RBC: 4.78 MIL/uL (ref 3.87–5.11)
RDW: 14.8 % (ref 11.5–15.5)
WBC: 6 10*3/uL (ref 4.0–10.5)
nRBC: 0 % (ref 0.0–0.2)

## 2019-12-11 LAB — TSH: TSH: 5.142 u[IU]/mL — ABNORMAL HIGH (ref 0.350–4.500)

## 2019-12-11 LAB — BASIC METABOLIC PANEL
Anion gap: 10 (ref 5–15)
BUN: 15 mg/dL (ref 8–23)
CO2: 27 mmol/L (ref 22–32)
Calcium: 9.2 mg/dL (ref 8.9–10.3)
Chloride: 100 mmol/L (ref 98–111)
Creatinine, Ser: 0.9 mg/dL (ref 0.44–1.00)
GFR calc Af Amer: >60 mL/min (ref >60)
GFR calc non Af Amer: >60 mL/min (ref >60)
Glucose, Bld: 156 mg/dL — ABNORMAL HIGH (ref 70–99)
Potassium: 3.6 mmol/L (ref 3.5–5.1)
Sodium: 137 mmol/L (ref 135–145)

## 2019-12-11 LAB — TROPONIN I (HIGH SENSITIVITY)
Troponin I (High Sensitivity): 3 ng/L (ref <18)
Troponin I (High Sensitivity): 3 ng/L (ref <18)

## 2019-12-11 LAB — I-STAT CREATININE, ED: Creatinine, Ser: 0.9 mg/dL (ref 0.44–1.00)

## 2019-12-11 MED ORDER — SODIUM CHLORIDE (PF) 0.9 % IJ SOLN
INTRAMUSCULAR | Status: AC
  Filled 2019-12-11: qty 50

## 2019-12-11 MED ORDER — IOHEXOL 350 MG/ML SOLN
100.0000 mL | Freq: Once | INTRAVENOUS | Status: AC | PRN
  Administered 2019-12-11: 100 mL via INTRAVENOUS

## 2019-12-11 NOTE — Discharge Instructions (Signed)
Please follow-up closely with your primary care provider for further management of your heart palpitation.  Your thyroid level is less than normal, you may benefit from medication readjustment.  Return to the ED if you have any concerns.

## 2019-12-11 NOTE — ED Triage Notes (Signed)
Pt c/o rapid heart rate, nausea, and neck pain that started this morning.

## 2019-12-11 NOTE — ED Provider Notes (Signed)
[facility] DEPT Provider Note   CSN: [id number] Arrival date & time: 12/11/19  3536     History Chief Complaint  Patient presents with  . Palpitations    Stacy Nash is a 69 y.o. female.  The history is provided by the patient and medical records. No language interpreter was used.  Palpitations    69 year old female with history of Graves' disease, hypertension, diabetes, presenting for evaluation of palpitation.  Patient reports since yesterday she has had persistent sensation of heart palpitation, feeling generally weak, having difficulty sleeping, feeling palpitation from her chest towards her neck, feels cold, and overall more anxious.  Patient states she has trouble sleeping last night, and this morning, her heart palpitation still persist prompting this ER visit.  She does not complain of any fever chills no productive cough no hemoptysis, no nausea vomiting diaphoresis dysuria or rash.  She has been compliant with her medication and there has not been any new medication changes.  She has been compliant with her diet, she does not report any alcohol or drug use.  She denies increased level of stress.  Patient reports she had similar episode a week ago prompting ER visit follows by observation for ACS rule out.  She mention her evaluation was unremarkable and subsequently was discharged home.  [facility] but states due to not [facility], she has difficulty get to her [facility] or her doctor, Thursday she decided to come to the ER.  She denies having trouble swallowing or voice changes.  Past Medical History:  Diagnosis Date  . Diabetes mellitus without complication (HCC)    Type 2  . Graves disease   . Hypertension   . Thyroid disease     Patient Active Problem List   Diagnosis Date Noted  . Chest pain 12/03/2019  . Benign essential HTN 12/03/2019  . Hypothyroidism 12/03/2019    Past Surgical History:  Procedure Laterality Date   . VAGINAL HYSTERECTOMY       OB History   No obstetric history on file.     History reviewed. No pertinent family history.  Social History   Tobacco Use  . Smoking status: Never Smoker  . Smokeless tobacco: Never Used  Substance Use Topics  . Alcohol use: No  . Drug use: Not on file    Home Medications Prior to Admission medications   Medication Sig Start Date End Date Taking? Authorizing Provider  amLODipine (NORVASC) 10 MG tablet Take 10 mg by mouth at bedtime.     [provider]  busPIRone (BUSPAR) 10 MG tablet Take 10 mg by mouth daily.     [provider]  carboxymethylcellulose (REFRESH PLUS) 0.5 % SOLN Place 1 drop into both eyes daily as needed (dry eyes).     [provider]  carvedilol (COREG) 25 MG tablet Take 25 mg by mouth 2 (two) times daily.     [provider]  diclofenac sodium (VOLTAREN) 1 % GEL Apply 1 application topically daily as needed (arthritis pain).    [provider]  fluticasone (FLONASE) 50 MCG/ACT nasal spray Place 1-2 sprays into both nostrils daily as needed for allergies or rhinitis.    [provider]  gabapentin (NEURONTIN) 100 MG capsule Take 200 mg by mouth 2 (two) times daily as needed (for Restless Legs Syndrome).     [provider]  hydrochlorothiazide (HYDRODIURIL) 50 MG tablet Take 50 mg by mouth daily.    [provider]  levothyroxine (SYNTHROID, LEVOTHROID) 88 MCG tablet Take 1 tablet (88 mcg total) by mouth daily before breakfast. 07/21/17   Petrucelli, Samantha R, PA-C  lidocaine (LIDODERM) 5 % Place 1 patch onto the skin daily as needed (back pain). Remove & Discard patch within 12 hours or as directed by MD    [provider]  losartan (COZAAR) 100 MG tablet Take 100 mg by mouth daily.    [provider]  magnesium oxide (MAG-OX) 400 MG tablet Take 800 mg by mouth daily.    [provider]  metFORMIN (GLUCOPHAGE) 500 MG tablet Take  500 mg by mouth See admin instructions. Take 500 mg by mouth in the morning before breakfast and an additional 500 mg at bedtime if needed for CBG >170    [provider]  naproxen sodium (ALEVE) 220 MG tablet Take 220 mg by mouth 2 (two) times daily as needed (headache/pain).    [provider]  omeprazole (PRILOSEC) 40 MG capsule Take 40 mg by mouth daily.    [provider]  potassium chloride 20 MEQ/15ML (10%) SOLN Take 30 mEq by mouth 2 (two) times daily.     [provider]  sodium chloride (OCEAN) 0.65 % SOLN nasal spray Place 1 spray into both nostrils as needed for congestion.    [provider]  traZODone (DESYREL) 50 MG tablet Take 25 mg by mouth at bedtime as needed (sleep).     [provider]    Allergies    Keflex [cephalexin] and Sudafed [pseudoephedrine hcl]  Review of Systems   Review of Systems  Cardiovascular: Positive for palpitations.  All other systems reviewed and are negative.   Physical Exam Updated Vital Signs BP (!) 159/98 (BP Location: Left Arm)   Pulse 76   Temp 98.1 F (36.7 C) (Oral)   Resp 15   Ht 5\' 6"  (1.676 m)   Wt 79.8 kg   SpO2 100%   BMI 28.41 kg/m   Physical Exam Vitals and nursing note reviewed.  Constitutional:      General: She is not in acute distress.    Appearance: She is well-developed.  HENT:     Head: Atraumatic.  Eyes:     Conjunctiva/sclera: Conjunctivae normal.  Neck:     Comments: Neck is supple, no carotid bruit, no thyromegaly Cardiovascular:     Rate and Rhythm: Normal rate and regular rhythm.     Pulses: Normal pulses.     Heart sounds: Normal heart sounds.  Pulmonary:     Effort: Pulmonary effort is normal.     Breath sounds: Normal breath sounds.  Chest:     Chest wall: Tenderness present.  Abdominal:     Palpations: Abdomen is soft.     Tenderness: There is no abdominal tenderness.  Musculoskeletal:        General: No swelling.     Cervical back:  Neck supple.     Right lower leg: No edema.     Left lower leg: No edema.  Skin:    Findings: No rash.  Neurological:     Mental Status: She is alert and oriented to person, place, and time.  Psychiatric:        Mood and Affect: Mood normal.     ED Results / Procedures / Treatments   Labs (all labs ordered are listed, but only abnormal results are displayed) Labs Reviewed  TSH - Abnormal; Notable for the following components:      Result Value  TSH 5.142 (*)    All other components within normal limits  BASIC METABOLIC PANEL - Abnormal; Notable for the following components:   Glucose, Bld 156 (*)    All other components within normal limits  CBC  I-STAT CHEM 8, ED  I-STAT CREATININE, ED  TROPONIN I (HIGH SENSITIVITY)  TROPONIN I (HIGH SENSITIVITY)    EKG EKG Interpretation  Date/Time:  Thursday December 11 2019 05:28:59 EDT Ventricular Rate:  72 PR Interval:    QRS Duration: 85 QT Interval:  409 QTC Calculation: 448 R Axis:   33 Text Interpretation: Sinus rhythm Confirmed by Palumbo, April ([id number]) on 12/11/2019 5:32:47 AM   Radiology DG Chest 2 View  Result Date: 12/11/2019 CLINICAL DATA:  Palpitations EXAM: CHEST - 2 VIEW COMPARISON:  December 03, 2019 FINDINGS: The heart size and mediastinal contours are within normal limits. Both lungs are clear. The visualized skeletal structures are unremarkable. IMPRESSION: No active cardiopulmonary disease. Electronically Signed   By: Jonna Clark M.D.   On: 12/11/2019 06:00   CT Angio Chest PE W and/or Wo Contrast  Result Date: 12/11/2019 CLINICAL DATA:  Palpitations EXAM: CT ANGIOGRAPHY CHEST WITH CONTRAST TECHNIQUE: Multidetector CT imaging of the chest was performed using the standard protocol during bolus administration of intravenous contrast. Multiplanar CT image reconstructions and MIPs were obtained to evaluate the vascular anatomy. CONTRAST:  OMNIPAQUE IOHEXOL 350 MG/ML SOLN COMPARISON:  None. FINDINGS:  Cardiovascular: No filling defects in the pulmonary arteries to suggest pulmonary emboli. Heart is normal size. Scattered coronary artery and aortic calcifications. Aorta normal caliber. Mediastinum/Nodes: No mediastinal, hilar, or axillary adenopathy. Trachea and esophagus are unremarkable. Thyroid unremarkable. Lungs/Pleura: Lungs are clear. No focal airspace opacities or suspicious nodules. No effusions. Upper Abdomen: Imaging into the upper abdomen shows no acute findings. Musculoskeletal: Chest wall soft tissues are unremarkable. No acute bony abnormality. Review of the MIP images confirms the above findings. IMPRESSION: No evidence of pulmonary embolus. No acute cardiopulmonary disease. Coronary artery disease. Aortic Atherosclerosis (ICD10-I70.0). Electronically Signed   By: Charlett Nose M.D.   On: 12/11/2019 08:01    Procedures Procedures (including critical care time)  Medications Ordered in ED Medications  sodium chloride (PF) 0.9 % injection (0 mLs  Hold 12/11/19 0823)  iohexol (OMNIPAQUE) 350 MG/ML injection 100 mL (100 mLs Intravenous Contrast Given 12/11/19 0725)    ED Course  I have reviewed the triage vital signs and the nursing notes.  Pertinent labs & imaging results that were available during my care of the patient were reviewed by me and considered in my medical decision making (see chart for details).    MDM Rules/Calculators/A&P                      BP (!) 159/98 (BP Location: Left Arm)   Pulse 76   Temp 98.1 F (36.7 C) (Oral)   Resp 15   Ht 5\' 6"  (1.676 m)   Wt 79.8 kg   SpO2 100%   BMI 28.41 kg/m   Final Clinical Impression(s) / ED Diagnoses Final diagnoses:  Palpitations    Rx / DC Orders ED Discharge Orders    None     6:50 AM Patient with history of thyroid disease status post thyroidectomy and currently on Synthroid presenting with complaints of heart palpitation radiates towards her neck, feeling generally weak and mild shortness of breath.   Fortunately EKG did not show any signs of atrial fibrillation's or tachycardia.  Chest x-ray is unremarkable,  normal troponin, normal CBC.  currently i-STAT Chem-8, TSH, currently pending.  Patient otherwise well-appearing, was recently hospitalized for similar episodes with unremarkable work-up.  Will check a TSH to assess her thyroid status and consider chest CTA to rule out PE.  9:16 AM TSH is 5.142, indicating that patient is experiencing hypothyroidism.  Patient states she is currently taking levothyroxine at 70 mcg daily.  I discussed that patient should follow-up with PCP for recalibration of her medication for more appropriate control of her thyroid to achieve euthyroid.  Negative delta troponin, low suspicion for ACS.  Labs are reassuring, no electrolytes abnormalities, normal H&H, normal WBC.  Chest CT angiogram without evidence of PE.  At this time patient is stable for discharge.  Return precaution discussed. Pt should f/u with PCP and with cardiologist.  May benefit from holter monitoring for heart palpitation.    Fayrene Helper, PA-C 12/11/19 1017    Pricilla Loveless, MD 12/12/19 [id number]

## 2019-12-11 NOTE — ED Notes (Signed)
An After Visit Summary was printed and given to the patient. Discharge instructions given and no further questions at this time.  Pt denies chest pain at this time.  

## 2019-12-15 ENCOUNTER — Encounter (HOSPITAL_COMMUNITY): Payer: Self-pay

## 2019-12-15 ENCOUNTER — Other Ambulatory Visit: Payer: Self-pay

## 2019-12-15 ENCOUNTER — Emergency Department (HOSPITAL_COMMUNITY)
Admission: EM | Admit: 2019-12-15 | Discharge: 2019-12-15 | Disposition: A | Discharge status: Home / Self Care | Payer: No Typology Code available for payment source | Attending: Emergency Medicine | Admitting: Emergency Medicine

## 2019-12-15 ENCOUNTER — Emergency Department (HOSPITAL_COMMUNITY): Payer: No Typology Code available for payment source

## 2019-12-15 DIAGNOSIS — R002 Palpitations: Secondary | ICD-10-CM | POA: Diagnosis present

## 2019-12-15 DIAGNOSIS — I10 Essential (primary) hypertension: Secondary | ICD-10-CM | POA: Diagnosis not present

## 2019-12-15 DIAGNOSIS — R42 Dizziness and giddiness: Secondary | ICD-10-CM | POA: Insufficient documentation

## 2019-12-15 DIAGNOSIS — R0789 Other chest pain: Secondary | ICD-10-CM | POA: Insufficient documentation

## 2019-12-15 DIAGNOSIS — E119 Type 2 diabetes mellitus without complications: Secondary | ICD-10-CM | POA: Diagnosis not present

## 2019-12-15 DIAGNOSIS — E0500 Thyrotoxicosis with diffuse goiter without thyrotoxic crisis or storm: Secondary | ICD-10-CM | POA: Insufficient documentation

## 2019-12-15 DIAGNOSIS — Z79899 Other long term (current) drug therapy: Secondary | ICD-10-CM | POA: Diagnosis not present

## 2019-12-15 LAB — BASIC METABOLIC PANEL
Anion gap: 12 (ref 5–15)
BUN: 19 mg/dL (ref 8–23)
CO2: 26 mmol/L (ref 22–32)
Calcium: 9.8 mg/dL (ref 8.9–10.3)
Chloride: 100 mmol/L (ref 98–111)
Creatinine, Ser: 1.23 mg/dL — ABNORMAL HIGH (ref 0.44–1.00)
GFR calc Af Amer: 53 mL/min — ABNORMAL LOW (ref >60)
GFR calc non Af Amer: 45 mL/min — ABNORMAL LOW (ref >60)
Glucose, Bld: 158 mg/dL — ABNORMAL HIGH (ref 70–99)
Potassium: 3.5 mmol/L (ref 3.5–5.1)
Sodium: 138 mmol/L (ref 135–145)

## 2019-12-15 LAB — TROPONIN I (HIGH SENSITIVITY)
Troponin I (High Sensitivity): 3 ng/L (ref <18)
Troponin I (High Sensitivity): 3 ng/L (ref <18)

## 2019-12-15 LAB — URINALYSIS, ROUTINE W REFLEX MICROSCOPIC
Bacteria, UA: NONE SEEN
Bilirubin Urine: NEGATIVE
Glucose, UA: NEGATIVE mg/dL
Hgb urine dipstick: NEGATIVE
Ketones, ur: NEGATIVE mg/dL
Nitrite: NEGATIVE
Protein, ur: NEGATIVE mg/dL
Specific Gravity, Urine: 1.004 — ABNORMAL LOW (ref 1.005–1.030)
pH: 9 — ABNORMAL HIGH (ref 5.0–8.0)

## 2019-12-15 LAB — CBC
HCT: 40.1 % (ref 36.0–46.0)
Hemoglobin: 13 g/dL (ref 12.0–15.0)
MCH: 26.7 pg (ref 26.0–34.0)
MCHC: 32.4 g/dL (ref 30.0–36.0)
MCV: 82.5 fL (ref 80.0–100.0)
Platelets: 262 10*3/uL (ref 150–400)
RBC: 4.86 MIL/uL (ref 3.87–5.11)
RDW: 14.7 % (ref 11.5–15.5)
WBC: 7.2 10*3/uL (ref 4.0–10.5)
nRBC: 0 % (ref 0.0–0.2)

## 2019-12-15 LAB — MAGNESIUM: Magnesium: 1.9 mg/dL (ref 1.7–2.4)

## 2019-12-15 MED ORDER — SODIUM CHLORIDE 0.9% FLUSH
3.0000 mL | Freq: Once | INTRAVENOUS | Status: AC
  Administered 2019-12-15: 3 mL via INTRAVENOUS

## 2019-12-15 MED ORDER — LACTATED RINGERS IV BOLUS
1000.0000 mL | Freq: Once | INTRAVENOUS | Status: AC
  Administered 2019-12-15: 1000 mL via INTRAVENOUS

## 2019-12-15 NOTE — ED Provider Notes (Signed)
[place] DEPT Provider Note   CSN: [id number] Arrival date & time: 12/15/19  1655     History Chief Complaint  Patient presents with  . Chest Pain  . Dizziness    Stacy Nash is a 69 y.o. female.  HPI   69 year old female with numerous complaints.  Intermittent palpitations throughout the day.  She states that she felt poorly when she woke up this morning.  Symptoms have persisted.  She states that she feels too tired to talk at times.  She seems very anxious.  She is concerned about her thyroid function.  She was told on recent ER visit that it is "low."  She was trying to follow-up through the [place] system but has yet to be seen by them.  She denies any acute pain.  Has felt dizzy at times today.  Denies room spinning sensation.  Reports compliance with all her medications.  Very concerned about living alone.  Past Medical History:  Diagnosis Date  . Diabetes mellitus without complication (HCC)    Type 2  . Graves disease   . Hypertension   . Thyroid disease     Patient Active Problem List   Diagnosis Date Noted  . Chest pain 12/03/2019  . Benign essential HTN 12/03/2019  . Hypothyroidism 12/03/2019    Past Surgical History:  Procedure Laterality Date  . VAGINAL HYSTERECTOMY       OB History   No obstetric history on file.     Family History  Problem Relation Age of Onset  . Diabetes Father     Social History   Tobacco Use  . Smoking status: Never Smoker  . Smokeless tobacco: Never Used  Substance Use Topics  . Alcohol use: No  . Drug use: Never    Home Medications Prior to Admission medications   Medication Sig Start Date End Date Taking? Authorizing Provider  amLODipine (NORVASC) 10 MG tablet Take 10 mg by mouth at bedtime.    Yes [provider]  busPIRone (BUSPAR) 10 MG tablet Take 10 mg by mouth daily.    Yes [provider]  carboxymethylcellulose (REFRESH PLUS) 0.5 % SOLN Place 1 drop into  both eyes daily as needed (dry eyes).    Yes [provider]  carvedilol (COREG) 25 MG tablet Take 25 mg by mouth 2 (two) times daily.    Yes [provider]  diclofenac sodium (VOLTAREN) 1 % GEL Apply 1 application topically daily as needed (arthritis pain).   Yes [provider]  fluticasone (FLONASE) 50 MCG/ACT nasal spray Place 1-2 sprays into both nostrils daily as needed for allergies or rhinitis.   Yes [provider]  gabapentin (NEURONTIN) 100 MG capsule Take 200 mg by mouth 2 (two) times daily as needed (for Restless Legs Syndrome).    Yes [provider]  hydrochlorothiazide (HYDRODIURIL) 50 MG tablet Take 50 mg by mouth daily.   Yes [provider]  levothyroxine (SYNTHROID, LEVOTHROID) 88 MCG tablet Take 1 tablet (88 mcg total) by mouth daily before breakfast. 07/21/17  Yes Petrucelli, Samantha R, PA-C  lidocaine (LIDODERM) 5 % Place 1 patch onto the skin daily as needed (back pain). Remove & Discard patch within 12 hours or as directed by MD   Yes [provider]  losartan (COZAAR) 100 MG tablet Take 100 mg by mouth daily.   Yes [provider]  magnesium oxide (MAG-OX) 400 MG tablet Take 800 mg by mouth daily.   Yes [provider]  metFORMIN (GLUCOPHAGE) 500 MG tablet Take 500 mg by mouth See admin instructions. Take 500 mg by mouth in the morning before breakfast and an additional 500 mg at bedtime if needed for CBG >170   Yes [provider]  naproxen sodium (ALEVE) 220 MG tablet Take 220 mg by mouth 2 (two) times daily as needed (headache/pain).   Yes [provider]  omeprazole (PRILOSEC) 40 MG capsule Take 40 mg by mouth daily.   Yes [provider]  potassium chloride 20 MEQ/15ML (10%) SOLN Take 30 mEq by mouth 2 (two) times daily.    Yes [provider]  sodium chloride (OCEAN) 0.65 % SOLN nasal spray Place 1 spray into both nostrils as needed for congestion.   Yes  [provider]  traZODone (DESYREL) 50 MG tablet Take 25 mg by mouth at bedtime as needed (sleep).    Yes [provider]    Allergies    Keflex [cephalexin] and Sudafed [pseudoephedrine hcl]  Review of Systems   Review of Systems All systems reviewed and negative, other than as noted in HPI.  Physical Exam Updated Vital Signs BP (!) 147/77   Pulse 66   Temp 98.2 F (36.8 C) (Oral)   Resp 11   Ht 5\' 6"  (1.676 m)   Wt 76.4 kg   SpO2 99%   BMI 27.18 kg/m   Physical Exam Vitals and nursing note reviewed.  Constitutional:      General: She is not in acute distress.    Appearance: She is well-developed.  HENT:     Head: Normocephalic and atraumatic.  Eyes:     General:        Right eye: No discharge.        Left eye: No discharge.     Conjunctiva/sclera: Conjunctivae normal.  Cardiovascular:     Rate and Rhythm: Normal rate and regular rhythm.     Heart sounds: Normal heart sounds. No murmur. No friction rub. No gallop.   Pulmonary:     Effort: Pulmonary effort is normal. No respiratory distress.     Breath sounds: Normal breath sounds.  Abdominal:     General: There is no distension.     Palpations: Abdomen is soft.     Tenderness: There is no abdominal tenderness.  Musculoskeletal:        General: No tenderness.     Cervical back: Neck supple.  Skin:    General: Skin is warm and dry.  Neurological:     Mental Status: She is alert.  Psychiatric:        Mood and Affect: Mood is anxious.        Behavior: Behavior normal.        Thought Content: Thought content normal.     ED Results / Procedures / Treatments   Labs (all labs ordered are listed, but only abnormal results are displayed) Labs Reviewed  BASIC METABOLIC PANEL - Abnormal; Notable for the following components:      Result Value   Glucose, Bld 158 (*)    Creatinine, Ser 1.23 (*)    GFR calc non Af Amer 45 (*)    GFR calc Af Amer 53 (*)    All other components within normal  limits  URINALYSIS, ROUTINE W REFLEX MICROSCOPIC - Abnormal; Notable for the following components:   Color, Urine STRAW (*)    Specific Gravity, Urine 1.004 (*)    pH 9.0 (*)    Leukocytes,Ua SMALL (*)  All other components within normal limits  CBC  MAGNESIUM  TROPONIN I (HIGH SENSITIVITY)  TROPONIN I (HIGH SENSITIVITY)    EKG None   EKG:  Rhythm: sinus Vent. rate 64 BPM QRS duration 96 ms QT/QTc 422/436 ms ST segments: NS ST changes   Radiology DG Chest 2 View  Result Date: 12/15/2019 CLINICAL DATA:  Chest pain EXAM: CHEST - 2 VIEW COMPARISON:  12/11/2019 FINDINGS: The heart size and mediastinal contours are within normal limits. Both lungs are clear. The visualized skeletal structures are unremarkable. IMPRESSION: No active cardiopulmonary disease. Electronically Signed   By: Elige Ko   On: 12/15/2019 17:59    Procedures Procedures (including critical care time)  Medications Ordered in ED Medications  sodium chloride flush (NS) 0.9 % injection 3 mL (3 mLs Intravenous Given 12/15/19 1753)  lactated ringers bolus 1,000 mL (1,000 mLs Intravenous New Bag/Given 12/15/19 1945)    ED Course  I have reviewed the triage vital signs and the nursing notes.  Pertinent labs & imaging results that were available during my care of the patient were reviewed by me and considered in my medical decision making (see chart for details).    MDM Rules/Calculators/A&P                      69 year old female with numerous complaints.  Primarily seems to be palpitations.  Occasional PVCs on the monitor.  Nothing else particularly concerning.  ED work-up pretty unremarkable.  She is concerned about her thyroid function.  Recent TSH was only minimally elevated.  I doubt that is significantly contributing to her symptoms.  Third ED visit in as many weeks and admitted the first time. There may potentially be an anxiety component. Regardless, I doubt emergent process.   Final Clinical  Impression(s) / ED Diagnoses Final diagnoses:  Palpitations    Rx / DC Orders ED Discharge Orders    None       Raeford Razor, MD 12/15/19 2034

## 2019-12-15 NOTE — Discharge Instructions (Addendum)
Your testing today is reassuring.  Your TSH from your last visit was mildly elevated, but is not to the point that I would suspect that it would give you significant symptoms.  You need to follow this up though with your typical providers.  Your EKG today does not show any significant abnormalities.  I know you are not feeling well, but I feel that you are medically appropriate for discharge at this time.  Please follow-up with your PCP.

## 2019-12-15 NOTE — ED Triage Notes (Signed)
Patient c/o chest pain under her left breast that started appox 2 hours ago. Patient states she began having dizziness before breakfast and states the dizziness worsened after having chest pain

## 2020-01-05 ENCOUNTER — Telehealth: Payer: Self-pay | Admitting: Gastroenterology

## 2020-01-05 NOTE — Telephone Encounter (Signed)
DOD 12/09/19  Dr. Orvan Falconer, [facility] H. [facility]   Pt had an EGD at the [place] in 08/2018.  Records will be sent to you for review.

## 2020-01-05 NOTE — Telephone Encounter (Signed)
I will look for them. Thank you.

## 2020-01-20 ENCOUNTER — Encounter: Payer: Self-pay | Admitting: Gastroenterology

## 2020-03-11 ENCOUNTER — Encounter: Payer: Self-pay | Admitting: Gastroenterology

## 2020-03-11 ENCOUNTER — Ambulatory Visit (INDEPENDENT_AMBULATORY_CARE_PROVIDER_SITE_OTHER): Payer: No Typology Code available for payment source | Admitting: Gastroenterology

## 2020-03-11 VITALS — BP 126/72 | HR 68 | Ht 65.0 in | Wt 177.1 lb

## 2020-03-11 DIAGNOSIS — B9681 Helicobacter pylori [H. pylori] as the cause of diseases classified elsewhere: Secondary | ICD-10-CM | POA: Diagnosis not present

## 2020-03-11 DIAGNOSIS — K2970 Gastritis, unspecified, without bleeding: Secondary | ICD-10-CM

## 2020-03-11 NOTE — Patient Instructions (Addendum)
I have recommended an upper endoscopy.  We will obtain gastric biopsies at that time that we will send off for culture and sensitivities to determine which antibiotics would best treat your H. Pylori.  I appreciate the opportunity to care for you. Tressia Danas, MD

## 2020-03-11 NOTE — Progress Notes (Addendum)
Referring Provider: Zachery Dauer, MD Primary Care Physician:  Zachery Dauer, MD  Reason for Consultation: Persistent H. pylori   IMPRESSION:  Persistent H pylori despite 4 courses of treatment Abdominal pain and bloating, resolved with treatment for H pylori  EGD with culture and sensitivities recommend to follow-up on her persistent H pylori testing.   PLAN: EGD with culture and sensitivities with MAC  The nature of the procedure, as well as the risks, benefits, and alternatives were carefully and thoroughly reviewed with the patient. Ample time for discussion and questions allowed. The patient understood, was satisfied, and agreed to proceed.  Please see the "Patient Instructions" section for addition details about the plan.   HPI: Stacy Nash is a 69 y.o. female with the PMH as outlined below. Referred by the [facility] for persistent H pylori. She completed the Covid vaccine.   EGD to evaluated abdominal pain 08/22/18 showed H pylori gastritis.  She was treated with quadruple therapy with bismuth, Flagyl, tetracycline, and PPI for 14 days.  Repeat stool antigen studies were positive.  She was then started on levofloxacin 500 mg daily, amoxicillin 1 g daily, and omeprazole 40 mg twice daily for 10 days.  Repeat stool antigen testing remained positive she was then started on amoxicillin 1 g 3 times daily and omeprazole 20 mg 3 times daily for 14 days.  Repeat stool antigen testing remained positive.  She was then started on right foot Butin 300 mg daily, amoxicillin 1 g twice daily, and omeprazole 40 mg twice daily for 10 days.  Her follow-up stool antigen test 06/13/2019 remains positive.  Patient reports 100% adherence with treatment.  Abdominal resolved in 2020. Rare abdominal bloating, but, she does not feel this needs further follow-up or treatment. Using omeprazole PRN - she doesn't feel like she needs it regularly. Appetite is good.  Weight is stable.  She reports a negative FIT  test in 01/2020.   No known family history of colon cancer or polyps. No family history of uterine/endometrial cancer, pancreatic cancer or gastric/stomach cancer.   Past Medical History:  Diagnosis Date  . Chronic venous insufficiency    peripheral  . Depression   . Diabetes mellitus without complication (HCC)    Type 2  . Graves disease   . Helicobacter pylori infection   . Hyperlipidemia   . Hypertension   . Hypothyroidism   . Memory deficit following unspecified cerebrovascular disease   . Metabolic syndrome   . Paroxysmal atrial fibrillation (HCC)   . PTSD (post-traumatic stress disorder)     Past Surgical History:  Procedure Laterality Date  . COLONOSCOPY    . ESOPHAGOGASTRODUODENOSCOPY    . THYROIDECTOMY    . VAGINAL HYSTERECTOMY      Current Outpatient Medications  Medication Sig Dispense Refill  . ACIDOPHILUS LACTOBACILLUS PO Take 2 tablets by mouth daily.    . busPIRone (BUSPAR) 10 MG tablet Take 10 mg by mouth 2 (two) times daily.     . cyclobenzaprine (FLEXERIL) 10 MG tablet Take 5 mg by mouth at bedtime as needed for muscle spasms.    Marland Kitchen gabapentin (NEURONTIN) 100 MG capsule Take 200 mg by mouth 3 (three) times daily.     . GUAIFENESIN PO Take 400 mg by mouth 2 (two) times daily as needed.    . hydrochlorothiazide (HYDRODIURIL) 50 MG tablet Take 50 mg by mouth daily.    Marland Kitchen latanoprost (XALATAN) 0.005 % ophthalmic solution Place 1 drop into the right eye  at bedtime.    Marland Kitchen levothyroxine (SYNTHROID) 75 MCG tablet Take 75 mcg by mouth daily before breakfast.    . lidocaine (LIDODERM) 5 % Place 1 patch onto the skin daily as needed (back pain). Remove & Discard patch within 12 hours or as directed by MD    . metFORMIN (GLUCOPHAGE) 500 MG tablet Take 500 mg by mouth See admin instructions. Take 500 mg by mouth in the morning before breakfast and an additional 500 mg at bedtime if needed for CBG >170    . metoprolol tartrate (LOPRESSOR) 50 MG tablet Take 50 mg by mouth in  the morning, at noon, and at bedtime.    Marland Kitchen omeprazole (PRILOSEC) 40 MG capsule Take 40 mg by mouth daily.    . ondansetron (ZOFRAN) 8 MG tablet Take 4 mg by mouth every 6 (six) hours as needed for nausea or vomiting.    . potassium chloride 20 MEQ/15ML (10%) SOLN Take 40 mEq by mouth 2 (two) times daily.     . traZODone (DESYREL) 50 MG tablet Take 25 mg by mouth at bedtime as needed (sleep).      No current facility-administered medications for this visit.    Allergies as of 03/11/2020 - Review Complete 03/11/2020  Allergen Reaction Noted  . Lisinopril  03/10/2020  . Montelukast  03/10/2020  . Risperidone and related  03/10/2020  . Keflex [cephalexin] Palpitations 07/23/2018  . Sudafed [pseudoephedrine hcl] Palpitations 10/23/2014    Family History  Problem Relation Age of Onset  . Diabetes Father   . Cervical cancer Sister   . Colon cancer Neg Hx   . Esophageal cancer Neg Hx   . Pancreatic cancer Neg Hx   . Stomach cancer Neg Hx     Social History   Socioeconomic History  . Marital status: Single    Spouse name: Not on file  . Number of children: Not on file  . Years of education: Not on file  . Highest education level: Not on file  Occupational History  . Not on file  Tobacco Use  . Smoking status: Never Smoker  . Smokeless tobacco: Never Used  Vaping Use  . Vaping Use: Never used  Substance and Sexual Activity  . Alcohol use: No  . Drug use: Never  . Sexual activity: Never  Other Topics Concern  . Not on file  Social History Narrative  . Not on file   Social Determinants of Health   Financial Resource Strain:   . Difficulty of Paying Living Expenses:   Food Insecurity:   . Worried About Charity fundraiser in the Last Year:   . Arboriculturist in the Last Year:   Transportation Needs:   . Film/video editor (Medical):   Marland Kitchen Lack of Transportation (Non-Medical):   Physical Activity:   . Days of Exercise per Week:   . Minutes of Exercise per Session:    Stress:   . Feeling of Stress :   Social Connections:   . Frequency of Communication with Friends and Family:   . Frequency of Social Gatherings with Friends and Family:   . Attends Religious Services:   . Active Member of Clubs or Organizations:   . Attends Archivist Meetings:   Marland Kitchen Marital Status:   Intimate Partner Violence:   . Fear of Current or Ex-Partner:   . Emotionally Abused:   Marland Kitchen Physically Abused:   . Sexually Abused:     Review of Systems: 12 system  ROS is negative except as noted above with the addition of anxiety, arthritis, vision changes, depression, and insomnia.   Physical Exam: General:   Alert,  well-nourished, pleasant and cooperative in NAD Head:  Normocephalic and atraumatic. Eyes:  Sclera clear, no icterus.   Conjunctiva pink. Ears:  Normal auditory acuity. Nose:  No deformity, discharge,  or lesions. Mouth:  No deformity or lesions.   Neck:  Supple; no masses or thyromegaly. Lungs:  Clear throughout to auscultation.   No wheezes. Heart:  Regular rate and rhythm; no murmurs. Abdomen:  Soft,nontender, nondistended, normal bowel sounds, no rebound or guarding. No hepatosplenomegaly.   Rectal:  Deferred  Msk:  Symmetrical. No boney deformities LAD: No inguinal or umbilical LAD Extremities:  No clubbing or edema. Neurologic:  Alert and  oriented x4;  grossly nonfocal Skin:  Intact without significant lesions or rashes. Psych:  Alert and cooperative. Normal mood and affect.     Alessia Gonsalez L. Tarri Glenn, MD, MPH 03/11/2020, 9:29 AM

## 2020-05-04 ENCOUNTER — Encounter: Payer: No Typology Code available for payment source | Admitting: Gastroenterology

## 2022-01-19 IMAGING — CT CT ANGIO CHEST
2 of 6 series · 19 of 46 positions shown · IV contrast (OMNIPAQUE)
Comparison: None.

CLINICAL DATA: Palpitations

EXAM:
CT ANGIOGRAPHY CHEST WITH CONTRAST
TECHNIQUE: Multidetector CT imaging of the chest was performed using the
standard protocol during bolus administration of intravenous
contrast. Multiplanar CT image reconstructions and MIPs were
obtained to evaluate the vascular anatomy.
CONTRAST:  100mL OMNIPAQUE IOHEXOL 350 MG/ML SOLN

[Series 5: thins · axial · 0.64mm/px · z∈[+1547,+1776]mm · 17 of 251 slices shown]
[im 11/251  lung]
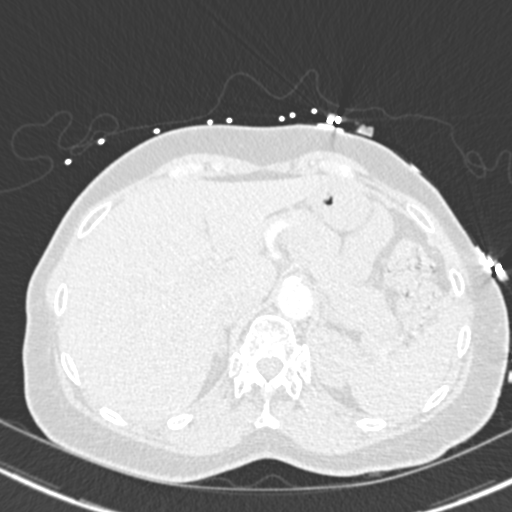
[im 22/251  soft-tissue]
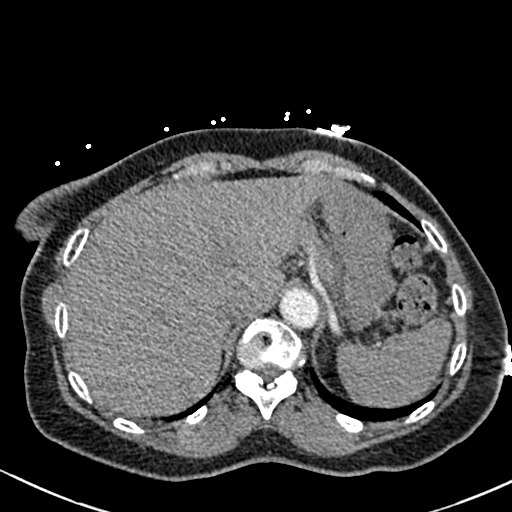
[im 44/251  lung]
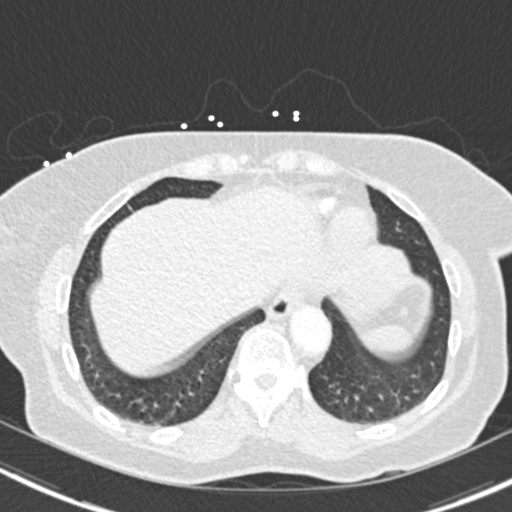
[im 55/251  soft-tissue]
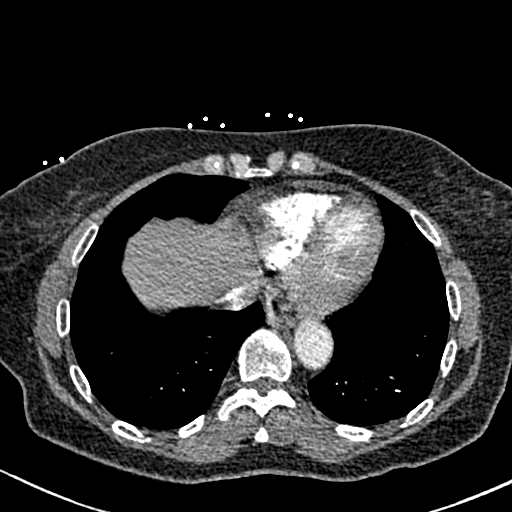
[im 66/251  lung]
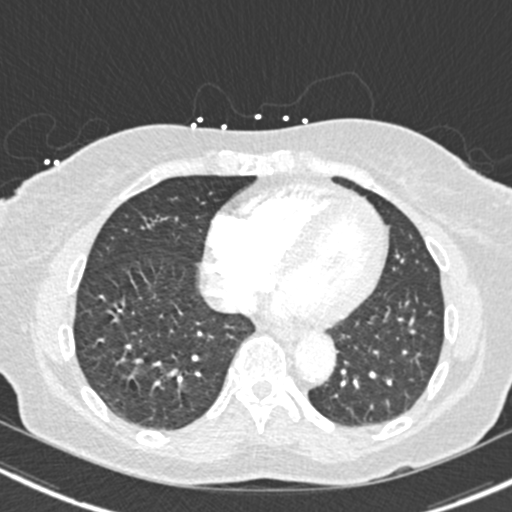
[im 87/251  soft-tissue]
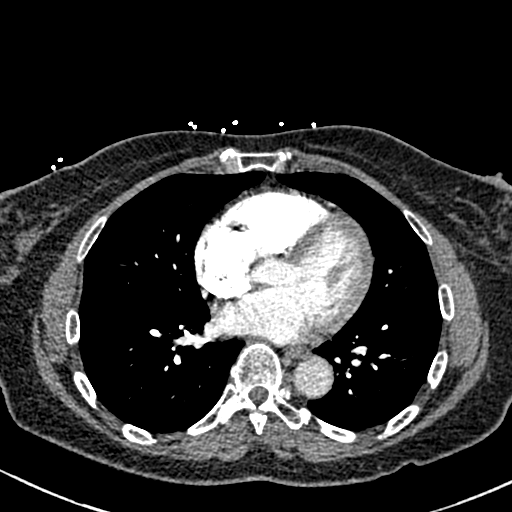
[im 98/251  lung]
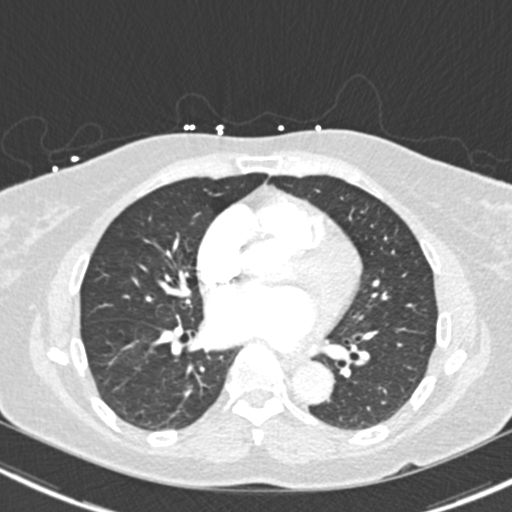
[im 109/251  soft-tissue]
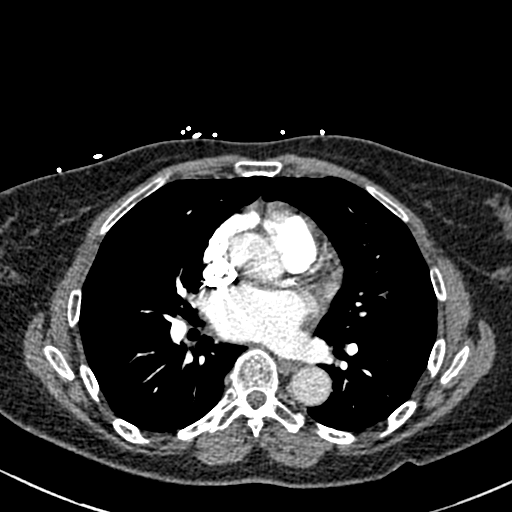
[im 131/251  lung]
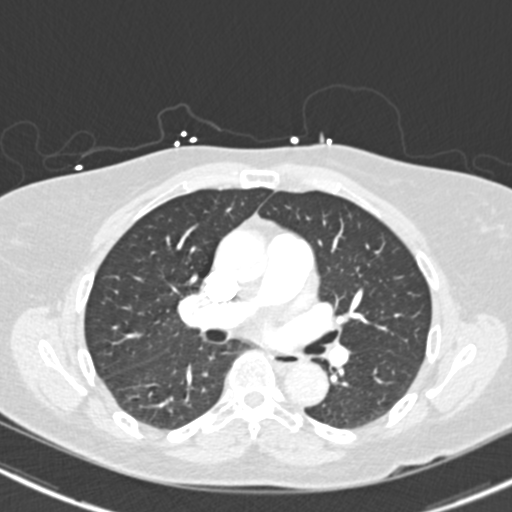
[im 142/251  soft-tissue]
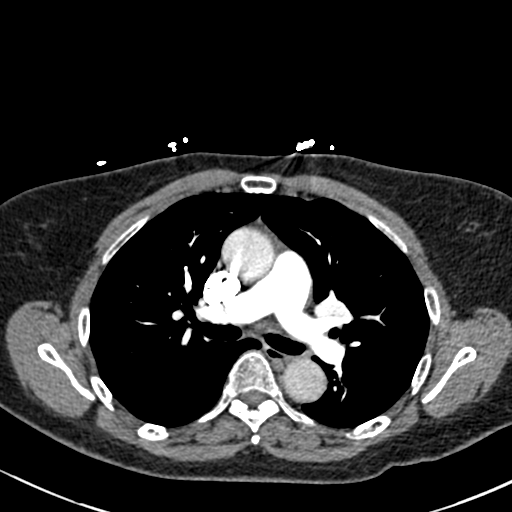
[im 153/251  lung]
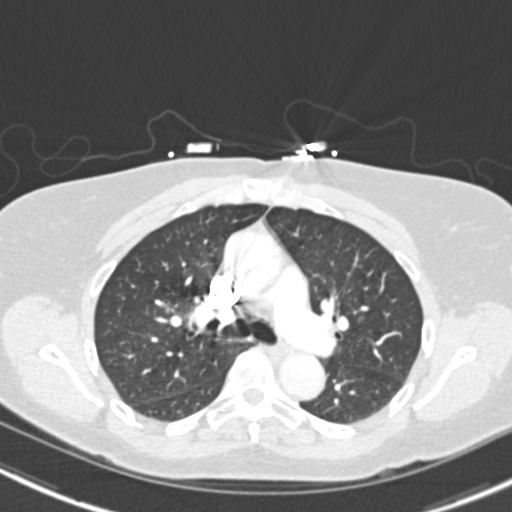
[im 164/251  soft-tissue]
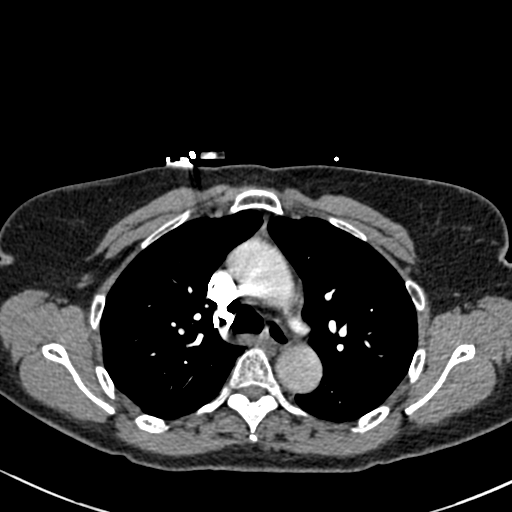
[im 185/251  lung]
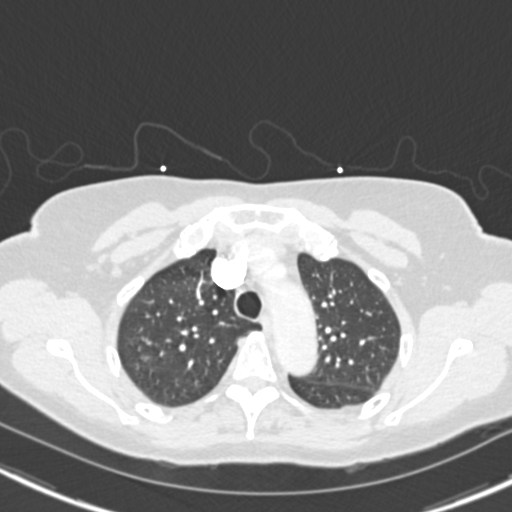
[im 196/251  soft-tissue]
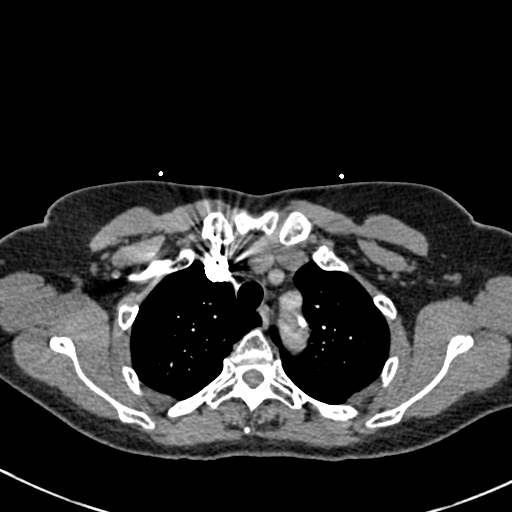
[im 207/251  lung]
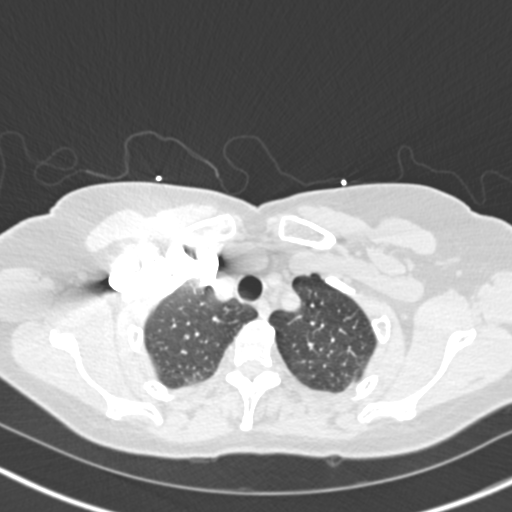
[im 229/251  soft-tissue]
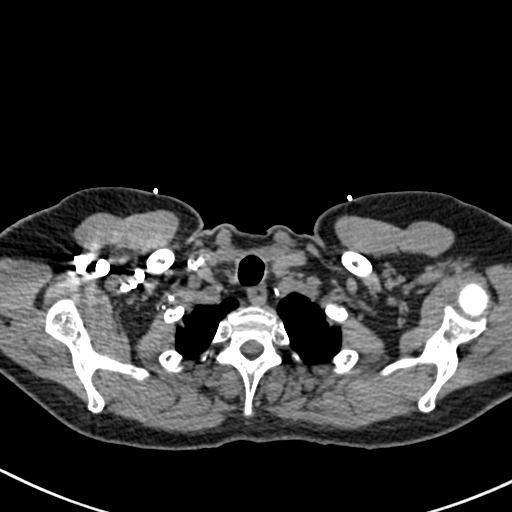
[im 240/251  lung]
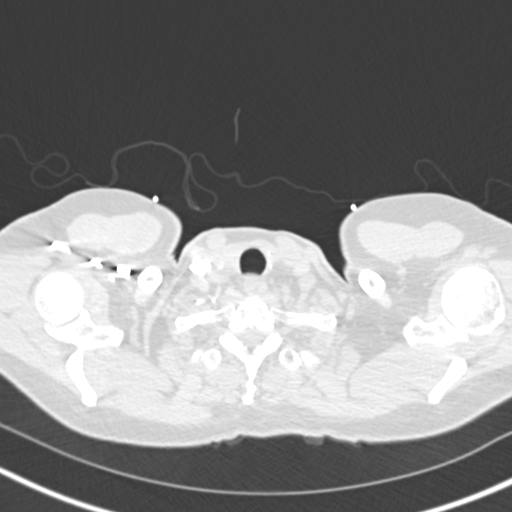

[Series 7: coronal mpr · coronal · 0.51mm/px · 2 of 73 slices shown]
[im 25/73  soft-tissue]
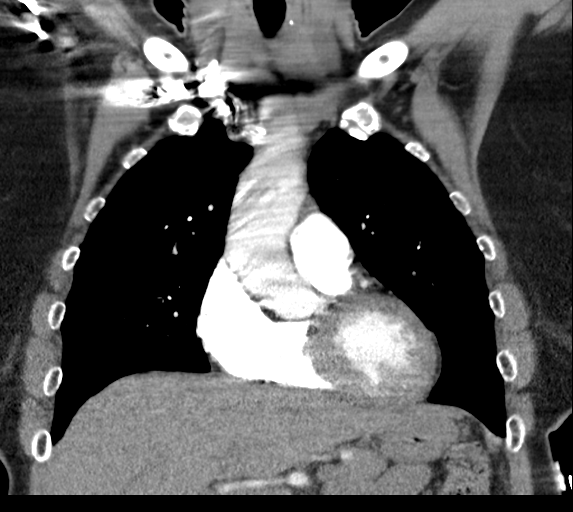
[im 49/73  soft-tissue]
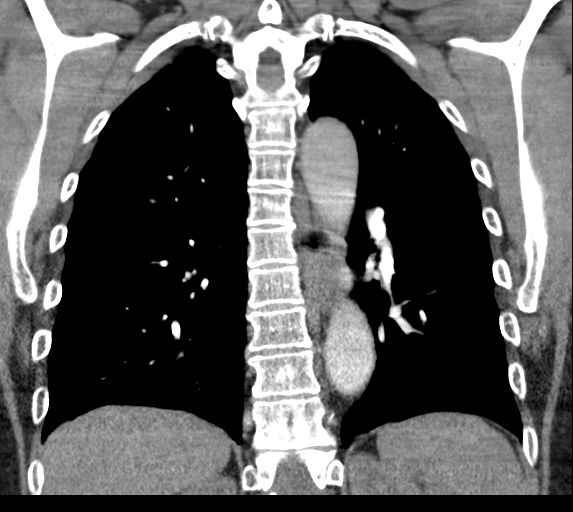

[19 of 46 positions shown; findings below may reference images not displayed]

FINDINGS: Cardiovascular: No filling defects in the pulmonary arteries to
suggest pulmonary emboli. Heart is normal size. Scattered coronary
artery and aortic calcifications. Aorta normal caliber.

Mediastinum/Nodes: No mediastinal, hilar, or axillary adenopathy.
Trachea and esophagus are unremarkable. Thyroid unremarkable.

Lungs/Pleura: Lungs are clear. No focal airspace opacities or
suspicious nodules. No effusions.

Upper Abdomen: Imaging into the upper abdomen shows no acute
findings.

Musculoskeletal: Chest wall soft tissues are unremarkable. No acute
bony abnormality.

Review of the MIP images confirms the above findings.
IMPRESSION: No evidence of pulmonary embolus.

No acute cardiopulmonary disease.

Coronary artery disease.

Aortic Atherosclerosis ([id number]).

## 2022-01-19 IMAGING — CR DG CHEST 2V
2 series · 2 of 2 positions shown · non-contrast
Comparison: December 03, 2019

CLINICAL DATA: Palpitations

EXAM:
CHEST - 2 VIEW

[w chest lat]
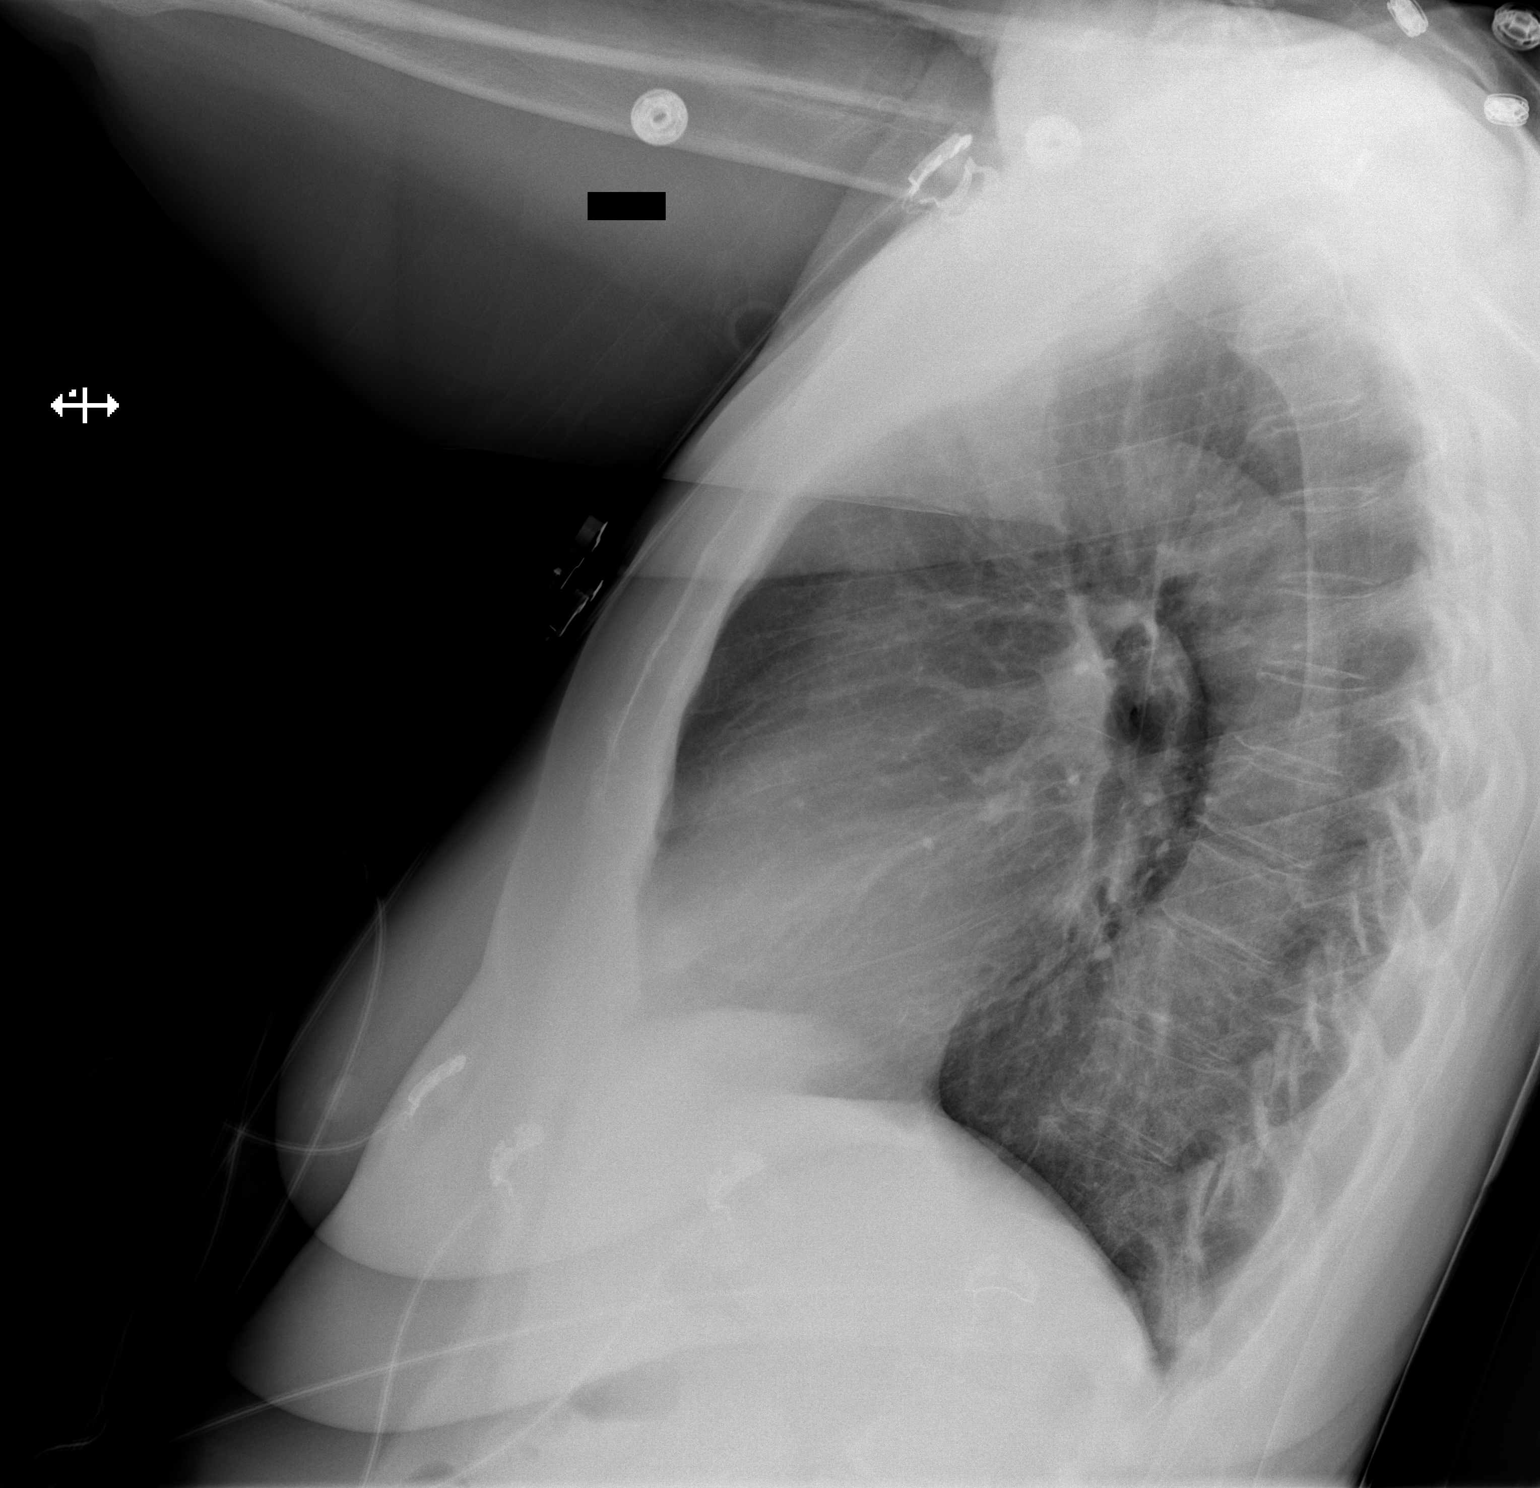

[x chest ap]
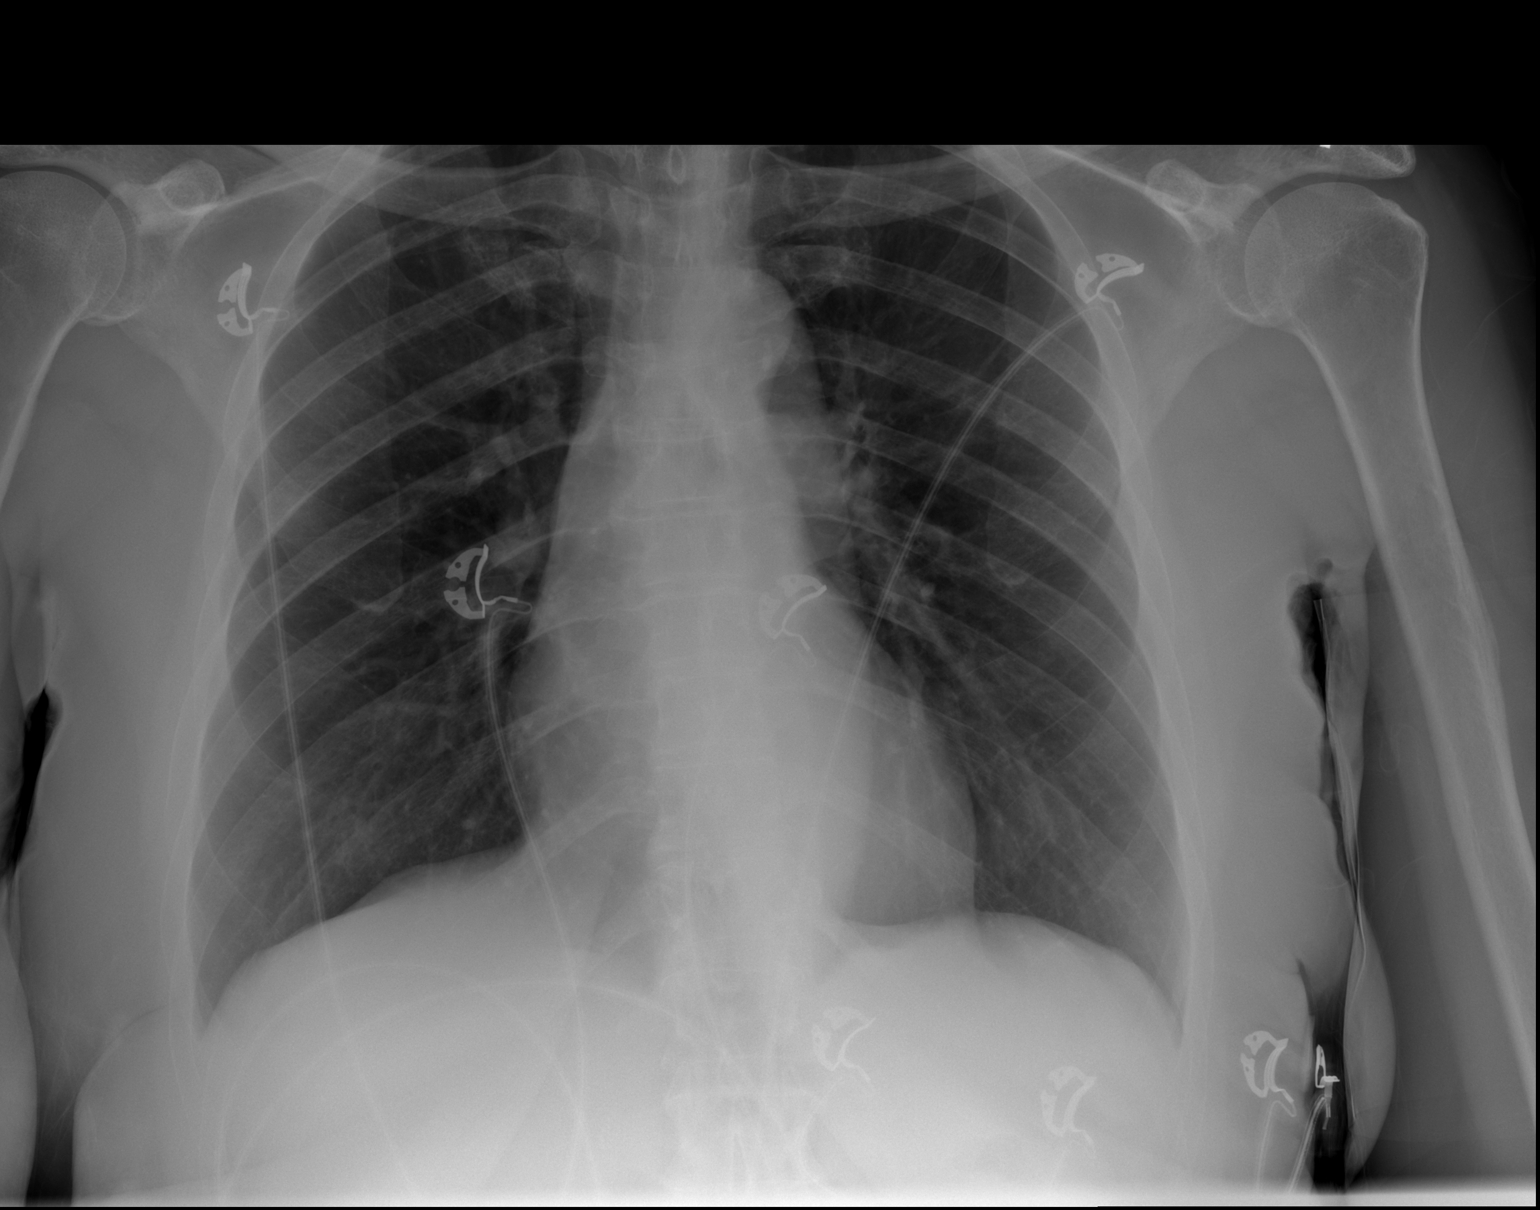

[2 of 2 positions shown; findings below may reference images not displayed]

FINDINGS: The heart size and mediastinal contours are within normal limits.
Both lungs are clear. The visualized skeletal structures are
unremarkable.
IMPRESSION: No active cardiopulmonary disease.

## 2022-06-12 ENCOUNTER — Emergency Department (HOSPITAL_COMMUNITY)
Admission: EM | Admit: 2022-06-12 | Discharge: 2022-06-13 | Disposition: A | Discharge status: Home / Self Care | Payer: Non-veteran care | Attending: Emergency Medicine | Admitting: Emergency Medicine

## 2022-06-12 ENCOUNTER — Encounter (HOSPITAL_COMMUNITY): Payer: Self-pay

## 2022-06-12 DIAGNOSIS — Z1152 Encounter for screening for COVID-19: Secondary | ICD-10-CM | POA: Insufficient documentation

## 2022-06-12 DIAGNOSIS — R1031 Right lower quadrant pain: Secondary | ICD-10-CM | POA: Diagnosis not present

## 2022-06-12 DIAGNOSIS — Z7984 Long term (current) use of oral hypoglycemic drugs: Secondary | ICD-10-CM | POA: Insufficient documentation

## 2022-06-12 DIAGNOSIS — Z79899 Other long term (current) drug therapy: Secondary | ICD-10-CM | POA: Diagnosis not present

## 2022-06-12 DIAGNOSIS — E039 Hypothyroidism, unspecified: Secondary | ICD-10-CM | POA: Diagnosis not present

## 2022-06-12 DIAGNOSIS — E860 Dehydration: Secondary | ICD-10-CM | POA: Diagnosis not present

## 2022-06-12 DIAGNOSIS — R531 Weakness: Secondary | ICD-10-CM | POA: Insufficient documentation

## 2022-06-12 DIAGNOSIS — I10 Essential (primary) hypertension: Secondary | ICD-10-CM | POA: Insufficient documentation

## 2022-06-12 DIAGNOSIS — E119 Type 2 diabetes mellitus without complications: Secondary | ICD-10-CM | POA: Diagnosis not present

## 2022-06-12 LAB — BASIC METABOLIC PANEL
Anion gap: 8 (ref 5–15)
BUN: 10 mg/dL (ref 8–23)
CO2: 20 mmol/L — ABNORMAL LOW (ref 22–32)
Calcium: 8.8 mg/dL — ABNORMAL LOW (ref 8.9–10.3)
Chloride: 110 mmol/L (ref 98–111)
Creatinine, Ser: 1.12 mg/dL — ABNORMAL HIGH (ref 0.44–1.00)
GFR, Estimated: 53 mL/min — ABNORMAL LOW (ref >60)
Glucose, Bld: 170 mg/dL — ABNORMAL HIGH (ref 70–99)
Potassium: 3.9 mmol/L (ref 3.5–5.1)
Sodium: 138 mmol/L (ref 135–145)

## 2022-06-12 LAB — HEPATIC FUNCTION PANEL
ALT: 15 U/L (ref 0–44)
AST: 20 U/L (ref 15–41)
Albumin: 4 g/dL (ref 3.5–5.0)
Alkaline Phosphatase: 76 U/L (ref 38–126)
Bilirubin, Direct: 0.2 mg/dL (ref 0.0–0.2)
Indirect Bilirubin: 0.5 mg/dL (ref 0.3–0.9)
Total Bilirubin: 0.7 mg/dL (ref 0.3–1.2)
Total Protein: 7.2 g/dL (ref 6.5–8.1)

## 2022-06-12 LAB — CBC
HCT: 42.1 % (ref 36.0–46.0)
Hemoglobin: 13.4 g/dL (ref 12.0–15.0)
MCH: 27.1 pg (ref 26.0–34.0)
MCHC: 31.8 g/dL (ref 30.0–36.0)
MCV: 85.2 fL (ref 80.0–100.0)
Platelets: 292 10*3/uL (ref 150–400)
RBC: 4.94 MIL/uL (ref 3.87–5.11)
RDW: 15.7 % — ABNORMAL HIGH (ref 11.5–15.5)
WBC: 7.9 10*3/uL (ref 4.0–10.5)
nRBC: 0 % (ref 0.0–0.2)

## 2022-06-12 LAB — RESP PANEL BY RT-PCR (FLU A&B, COVID) ARPGX2
Influenza A by PCR: NEGATIVE
Influenza B by PCR: NEGATIVE
SARS Coronavirus 2 by RT PCR: NEGATIVE

## 2022-06-12 NOTE — ED Provider Triage Note (Signed)
Emergency Medicine Provider Triage Evaluation Note  Stacy Nash , a 71 y.o. female  was evaluated in triage.  Pt complains of generalized fatiguex2 days. No chest pain, nausea, vomiting, urinary symptoms, shortness of breath.  Review of Systems  Positive: Fatigue, poor appetite Negative: Nausea, vomiting  Physical Exam  BP (!) 189/92   Pulse (!) 55   Temp 98.4 F (36.9 C) (Oral)   Resp 18   SpO2 100%  Gen:   Awake, no distress   Resp:  Normal effort  MSK:   Moves extremities without difficulty  Other:  Mild bilateral lower quadrant abdominal ttp w/o guarding, soft  Medical Decision Making  Medically screening exam initiated at 10:33 PM.  Appropriate orders placed.  Christen Butter was informed that the remainder of the evaluation will be completed by another provider, this initial triage assessment does not replace that evaluation, and the importance of remaining in the ED until their evaluation is complete.     Osvaldo Shipper, [place] 06/12/22 2236

## 2022-06-12 NOTE — ED Triage Notes (Signed)
Pt comes via [facility] for generalized weakness, n/v that has been going on for 2 days.

## 2022-06-12 NOTE — ED Triage Notes (Signed)
PTA pt received 4mg  zofran

## 2022-06-13 LAB — MAGNESIUM: Magnesium: 2.1 mg/dL (ref 1.7–2.4)

## 2022-06-13 LAB — URINALYSIS, ROUTINE W REFLEX MICROSCOPIC
Bacteria, UA: NONE SEEN
Bilirubin Urine: NEGATIVE
Glucose, UA: NEGATIVE mg/dL
Hgb urine dipstick: NEGATIVE
Ketones, ur: 5 mg/dL — AB
Nitrite: NEGATIVE
Protein, ur: NEGATIVE mg/dL
Specific Gravity, Urine: 1.009 (ref 1.005–1.030)
pH: 6 (ref 5.0–8.0)

## 2022-06-13 LAB — TSH: TSH: 9.587 u[IU]/mL — ABNORMAL HIGH (ref 0.350–4.500)

## 2022-06-13 LAB — T4, FREE: Free T4: 1.03 ng/dL (ref 0.61–1.12)

## 2022-06-13 LAB — CBG MONITORING, ED: Glucose-Capillary: 140 mg/dL — ABNORMAL HIGH (ref 70–99)

## 2022-06-13 MED ORDER — SODIUM CHLORIDE 0.9 % IV BOLUS
1000.0000 mL | Freq: Once | INTRAVENOUS | Status: AC
  Administered 2022-06-13: 1000 mL via INTRAVENOUS

## 2022-06-13 MED ORDER — ONDANSETRON 8 MG PO TBDP: 8.0000 mg | ORAL_TABLET | Freq: Three times a day (TID) | ORAL | 0 refills | Status: AC | PRN

## 2022-06-13 MED ORDER — ONDANSETRON HCL 4 MG/2ML IJ SOLN
4.0000 mg | Freq: Once | INTRAMUSCULAR | Status: AC
  Administered 2022-06-13: 4 mg via INTRAVENOUS

## 2022-06-13 NOTE — ED Provider Notes (Signed)
[facility] DEPT Provider Note: Georgena Spurling, MD, FACEP  CSN: [id number] MRN: [id number] ARRIVAL: 06/12/22 at [place]: [place]  06/13/22 2:48 AM Stacy Nash is a 71 y.o. female with 2 days of severe generalized weakness.  She has been unable to get out of bed for 2 days because her legs are too weak to carry her.  She has not gotten up to go to the bathroom and has been urinating in the bedside garbage can.  When she called EMS she had to crawl down the stairs to get to the ambulance.  She is not in any pain.  She has had nausea but no vomiting and has had little oral intake.  She has only been able to keep down some broth.  She had a right lower quadrant pain earlier that has subsequently resolved.  She is feeling chills.  She has a history of hypothyroidism associated with treatment of Graves' disease, and is treated with Synthroid.  She is having no focal weakness.    Past Medical History:  Diagnosis Date   Chronic venous insufficiency    peripheral   Depression    Diabetes mellitus without complication (HCC)    Type 2   Graves disease    Helicobacter pylori infection    Hyperlipidemia    Hypertension    Hypothyroidism    Memory deficit following unspecified cerebrovascular disease    Metabolic syndrome    Paroxysmal atrial fibrillation (HCC)    PTSD (post-traumatic stress disorder)     Past Surgical History:  Procedure Laterality Date   COLONOSCOPY     ESOPHAGOGASTRODUODENOSCOPY     THYROIDECTOMY     VAGINAL HYSTERECTOMY      Family History  Problem Relation Age of Onset   Diabetes Father    Cervical cancer Sister    Colon cancer Neg Hx    Esophageal cancer Neg Hx    Pancreatic cancer Neg Hx    Stomach cancer Neg Hx     Social History   Tobacco Use   Smoking status: Never   Smokeless tobacco: Never  Vaping Use   Vaping Use: Never used  Substance Use Topics   Alcohol use: No   Drug  use: Never    Prior to Admission medications   Medication Sig Start Date End Date Taking? Authorizing Provider  ACIDOPHILUS LACTOBACILLUS PO Take 2 tablets by mouth daily.    [provider]  busPIRone (BUSPAR) 10 MG tablet Take 10 mg by mouth 2 (two) times daily.     [provider]  cyclobenzaprine (FLEXERIL) 10 MG tablet Take 5 mg by mouth at bedtime as needed for muscle spasms.    [provider]  gabapentin (NEURONTIN) 100 MG capsule Take 200 mg by mouth 3 (three) times daily.     [provider]  GUAIFENESIN PO Take 400 mg by mouth 2 (two) times daily as needed.    [provider]  hydrochlorothiazide (HYDRODIURIL) 50 MG tablet Take 50 mg by mouth daily.    [provider]  latanoprost (XALATAN) 0.005 % ophthalmic solution Place 1 drop into the right eye at bedtime.    [provider]  levothyroxine (SYNTHROID) 75 MCG tablet Take 75 mcg by mouth daily before breakfast.    [provider]  lidocaine (LIDODERM) 5 % Place 1 patch onto the skin daily as needed (back pain). Remove & Discard patch within 12 hours  or as directed by MD    [provider]  metFORMIN (GLUCOPHAGE) 500 MG tablet Take 500 mg by mouth See admin instructions. Take 500 mg by mouth in the morning before breakfast and an additional 500 mg at bedtime if needed for CBG >170    [provider]  metoprolol tartrate (LOPRESSOR) 50 MG tablet Take 50 mg by mouth in the morning, at noon, and at bedtime.    [provider]  omeprazole (PRILOSEC) 40 MG capsule Take 40 mg by mouth daily.    [provider]  ondansetron (ZOFRAN) 8 MG tablet Take 4 mg by mouth every 6 (six) hours as needed for nausea or vomiting.    [provider]  potassium chloride 20 MEQ/15ML (10%) SOLN Take 40 mEq by mouth 2 (two) times daily.     [provider]  traZODone (DESYREL) 50 MG tablet Take 25 mg by mouth at bedtime as needed  (sleep).     [provider]    Allergies Lisinopril, Montelukast, Risperidone and related, Keflex [cephalexin], and Sudafed [pseudoephedrine hcl]   REVIEW OF SYSTEMS  Negative except as noted here or in the History of Present Illness.   PHYSICAL EXAMINATION  Initial Vital Signs Blood pressure (!) 189/92, pulse (!) 55, temperature 98 F (36.7 C), temperature source Oral, resp. rate 16, SpO2 100 %.  Examination General: Well-developed, well-nourished female in no acute distress; appearance consistent with age of record HENT: normocephalic; atraumatic Eyes: pupils equal, round and reactive to light; extraocular muscles intact Neck: supple Heart: regular rate and rhythm Lungs: clear to auscultation bilaterally Abdomen: soft; nondistended; nontender; bowel sounds present Extremities: No deformity; full range of motion; pulses normal Neurologic: Awake, alert and oriented; motor function intact in all extremities and symmetric; no facial droop Skin: Warm and dry Psychiatric: Flat affect   RESULTS  Summary of this visit's results, reviewed and interpreted by myself:   EKG Interpretation  Date/Time:  Monday June 12 2022 20:09:28 EDT Ventricular Rate:  51 PR Interval:  188 QRS Duration: 84 QT Interval:  453 QTC Calculation: 418 R Axis:   53 Text Interpretation: Sinus bradycardia Borderline T abnormalities, diffuse leads   Confirmed by Shanon Rosser [phone]) on 06/13/2022 3:49:40 AM       Laboratory Studies: Results for orders placed or performed during the hospital encounter of 06/12/22 (from the past 24 hour(s))  Basic metabolic panel     Status: Abnormal   Collection Time: 06/12/22  8:35 PM  Result Value Ref Range   Sodium 138 135 - 145 mmol/L   Potassium 3.9 3.5 - 5.1 mmol/L   Chloride 110 98 - 111 mmol/L   CO2 20 (L) 22 - 32 mmol/L   Glucose, Bld 170 (H) 70 - 99 mg/dL   BUN 10 8 - 23 mg/dL   Creatinine, Ser 1.12 (H) 0.44 - 1.00 mg/dL   Calcium 8.8 (L)  8.9 - 10.3 mg/dL   GFR, Estimated 53 (L) >60 mL/min   Anion gap 8 5 - 15  CBC     Status: Abnormal   Collection Time: 06/12/22  8:35 PM  Result Value Ref Range   WBC 7.9 4.0 - 10.5 K/uL   RBC 4.94 3.87 - 5.11 MIL/uL   Hemoglobin 13.4 12.0 - 15.0 g/dL   HCT 42.1 36.0 - 46.0 %   MCV 85.2 80.0 - 100.0 fL   MCH 27.1 26.0 - 34.0 pg   MCHC 31.8 30.0 - 36.0 g/dL   RDW 15.7 (  H) 11.5 - 15.5 %   Platelets 292 150 - 400 K/uL   nRBC 0.0 0.0 - 0.2 %  Resp Panel by RT-PCR (Flu A&B, Covid) Anterior Nasal Swab     Status: None   Collection Time: 06/12/22 10:38 PM   Specimen: Anterior Nasal Swab  Result Value Ref Range   SARS Coronavirus 2 by RT PCR NEGATIVE NEGATIVE   Influenza A by PCR NEGATIVE NEGATIVE   Influenza B by PCR NEGATIVE NEGATIVE  Hepatic function panel     Status: None   Collection Time: 06/12/22 10:58 PM  Result Value Ref Range   Total Protein 7.2 6.5 - 8.1 g/dL   Albumin 4.0 3.5 - 5.0 g/dL   AST 20 15 - 41 U/L   ALT 15 0 - 44 U/L   Alkaline Phosphatase 76 38 - 126 U/L   Total Bilirubin 0.7 0.3 - 1.2 mg/dL   Bilirubin, Direct 0.2 0.0 - 0.2 mg/dL   Indirect Bilirubin 0.5 0.3 - 0.9 mg/dL  TSH     Status: Abnormal   Collection Time: 06/12/22 10:58 PM  Result Value Ref Range   TSH 9.587 (H) 0.350 - 4.500 uIU/mL  T4, free     Status: None   Collection Time: 06/12/22 10:58 PM  Result Value Ref Range   Free T4 1.03 0.61 - 1.12 ng/dL  Magnesium     Status: None   Collection Time: 06/13/22  2:16 AM  Result Value Ref Range   Magnesium 2.1 1.7 - 2.4 mg/dL  CBG monitoring, ED     Status: Abnormal   Collection Time: 06/13/22  2:42 AM  Result Value Ref Range   Glucose-Capillary 140 (H) 70 - 99 mg/dL   Imaging Studies: No results found.  ED COURSE and MDM  Nursing notes, initial and subsequent vitals signs, including pulse oximetry, reviewed and interpreted by myself.  Vitals:   06/13/22 0248 06/13/22 0430 06/13/22 0501 06/13/22 0600  BP: (!) 197/87 (!) 163/74  (!) 178/76   Pulse: (!) 56 62  (!) 51  Resp: 18 16  16   Temp: 99 F (37.2 C)  98 F (36.7 C)   TempSrc: Oral     SpO2: 98% 95%  99%   Medications  ondansetron (ZOFRAN) injection 4 mg (has no administration in time range)  sodium chloride 0.9 % bolus 1,000 mL (0 mLs Intravenous Stopped 06/13/22 0521)  sodium chloride 0.9 % bolus 1,000 mL (0 mLs Intravenous Stopped 06/13/22 0613)  sodium chloride 0.9 % bolus 1,000 mL (1,000 mLs Intravenous New Bag/Given 06/13/22 0613)   5:15 AM Still no urine output after 1 L of normal saline.  We will repeat the bolus.  6:09 AM Patient feeling better after second liter of fluid and is now able to ambulate slowly and stand on her own.  She was able to provide a urine specimen which nursing staff reports was dark.  I suspect the patient was dehydrated and we will infuse another liter of normal saline.   PROCEDURES  Procedures   ED DIAGNOSES     ICD-10-CM   1. Generalized weakness  R53.1     2. Dehydration  E86.0          Kino Dunsworth, Jenny Reichmann, MD 06/13/22 [phone]

## 2022-06-28 ENCOUNTER — Encounter (HOSPITAL_COMMUNITY): Payer: Self-pay | Admitting: Emergency Medicine

## 2022-06-28 ENCOUNTER — Emergency Department (HOSPITAL_COMMUNITY): Payer: No Typology Code available for payment source

## 2022-06-28 ENCOUNTER — Other Ambulatory Visit: Payer: Self-pay

## 2022-06-28 ENCOUNTER — Emergency Department (HOSPITAL_COMMUNITY)
Admission: EM | Admit: 2022-06-28 | Discharge: 2022-06-28 | Disposition: A | Discharge status: Home / Self Care | Payer: No Typology Code available for payment source | Attending: Emergency Medicine | Admitting: Emergency Medicine

## 2022-06-28 DIAGNOSIS — R030 Elevated blood-pressure reading, without diagnosis of hypertension: Secondary | ICD-10-CM | POA: Diagnosis present

## 2022-06-28 DIAGNOSIS — R519 Headache, unspecified: Secondary | ICD-10-CM | POA: Diagnosis not present

## 2022-06-28 DIAGNOSIS — Z79899 Other long term (current) drug therapy: Secondary | ICD-10-CM | POA: Diagnosis not present

## 2022-06-28 DIAGNOSIS — I10 Essential (primary) hypertension: Secondary | ICD-10-CM | POA: Diagnosis not present

## 2022-06-28 LAB — URINALYSIS, ROUTINE W REFLEX MICROSCOPIC
Bacteria, UA: NONE SEEN
Bilirubin Urine: NEGATIVE
Glucose, UA: NEGATIVE mg/dL
Ketones, ur: 5 mg/dL — AB
Leukocytes,Ua: NEGATIVE
Nitrite: NEGATIVE
Protein, ur: 30 mg/dL — AB
Specific Gravity, Urine: 1.011 (ref 1.005–1.030)
pH: 6 (ref 5.0–8.0)

## 2022-06-28 LAB — CBC WITH DIFFERENTIAL/PLATELET
Abs Immature Granulocytes: 0.02 10*3/uL (ref 0.00–0.07)
Basophils Absolute: 0.1 10*3/uL (ref 0.0–0.1)
Basophils Relative: 1 %
Eosinophils Absolute: 0 10*3/uL (ref 0.0–0.5)
Eosinophils Relative: 0 %
HCT: 45.7 % (ref 36.0–46.0)
Hemoglobin: 14.1 g/dL (ref 12.0–15.0)
Immature Granulocytes: 0 %
Lymphocytes Relative: 27 %
Lymphs Abs: 1.5 10*3/uL (ref 0.7–4.0)
MCH: 26.9 pg (ref 26.0–34.0)
MCHC: 30.9 g/dL (ref 30.0–36.0)
MCV: 87 fL (ref 80.0–100.0)
Monocytes Absolute: 0.3 10*3/uL (ref 0.1–1.0)
Monocytes Relative: 5 %
Neutro Abs: 3.6 10*3/uL (ref 1.7–7.7)
Neutrophils Relative %: 67 %
Platelets: 274 10*3/uL (ref 150–400)
RBC: 5.25 MIL/uL — ABNORMAL HIGH (ref 3.87–5.11)
RDW: 16 % — ABNORMAL HIGH (ref 11.5–15.5)
WBC: 5.5 10*3/uL (ref 4.0–10.5)
nRBC: 0 % (ref 0.0–0.2)

## 2022-06-28 LAB — BASIC METABOLIC PANEL
Anion gap: 7 (ref 5–15)
BUN: 9 mg/dL (ref 8–23)
CO2: 23 mmol/L (ref 22–32)
Calcium: 9.3 mg/dL (ref 8.9–10.3)
Chloride: 109 mmol/L (ref 98–111)
Creatinine, Ser: 0.9 mg/dL (ref 0.44–1.00)
GFR, Estimated: >60 mL/min (ref >60)
Glucose, Bld: 169 mg/dL — ABNORMAL HIGH (ref 70–99)
Potassium: 3.6 mmol/L (ref 3.5–5.1)
Sodium: 139 mmol/L (ref 135–145)

## 2022-06-28 LAB — TROPONIN I (HIGH SENSITIVITY)
Troponin I (High Sensitivity): 14 ng/L (ref <18)
Troponin I (High Sensitivity): 20 ng/L — ABNORMAL HIGH (ref <18)

## 2022-06-28 LAB — CBG MONITORING, ED: Glucose-Capillary: 119 mg/dL — ABNORMAL HIGH (ref 70–99)

## 2022-06-28 MED ORDER — LACTATED RINGERS IV BOLUS
500.0000 mL | Freq: Once | INTRAVENOUS | Status: AC
  Administered 2022-06-28: 500 mL via INTRAVENOUS

## 2022-06-28 MED ORDER — METOPROLOL TARTRATE 25 MG PO TABS
50.0000 mg | ORAL_TABLET | Freq: Once | ORAL | Status: AC
  Administered 2022-06-28: 50 mg via ORAL
  Filled 2022-06-28: qty 2

## 2022-06-28 MED ORDER — ACETAMINOPHEN 325 MG PO TABS: 650.0000 mg | ORAL_TABLET | Freq: Once | ORAL | Status: DC

## 2022-06-28 MED ORDER — PROCHLORPERAZINE EDISYLATE 10 MG/2ML IJ SOLN
10.0000 mg | Freq: Once | INTRAMUSCULAR | Status: AC
  Administered 2022-06-28: 10 mg via INTRAVENOUS
  Filled 2022-06-28: qty 2

## 2022-06-28 MED ORDER — HYDRALAZINE HCL 20 MG/ML IJ SOLN
10.0000 mg | Freq: Once | INTRAMUSCULAR | Status: AC
  Administered 2022-06-28: 10 mg via INTRAVENOUS
  Filled 2022-06-28: qty 1

## 2022-06-28 MED ORDER — HYDRALAZINE HCL 25 MG PO TABS
50.0000 mg | ORAL_TABLET | Freq: Once | ORAL | Status: AC
  Administered 2022-06-28: 50 mg via ORAL
  Filled 2022-06-28: qty 2

## 2022-06-28 MED ORDER — ACETAMINOPHEN 500 MG PO TABS
1000.0000 mg | ORAL_TABLET | Freq: Once | ORAL | Status: AC
  Administered 2022-06-28: 1000 mg via ORAL
  Filled 2022-06-28: qty 2

## 2022-06-28 NOTE — ED Triage Notes (Signed)
Pt BIB EMS from home with c/o hypertension and a headache since yesterday. Pt states that she is compliant with her medications

## 2022-06-28 NOTE — ED Provider Triage Note (Signed)
  Emergency Medicine Provider Triage Evaluation Note  MRN:  [id number]  Arrival date & time: 06/28/22    Medically screening exam initiated at 4:02 AM.   CC:   Hypertension   HPI:  Stacy Nash is a 71 y.o. year-old female presents to the ED with chief complaint of elevated BP.  Reports headache and neck pain.  States that she has had some tingling in her hands and feet.  States that the symptoms started 2 days ago.  Has been compliant with her meds.  History provided by patient. ROS:  -As included in HPI PE:   Vitals:   06/28/22 0343 06/28/22 0345  BP: (!) 188/85 (!) 179/97  Pulse: 62 63  Resp: 16   Temp: 98.2 F (36.8 C)   SpO2: 99% 100%    Non-toxic appearing No respiratory distress  MDM:  Based on signs and symptoms, hypertensive urgency is highest on my differential. I've ordered labs and imaging in triage to expedite lab/diagnostic workup.  Patient was informed that the remainder of the evaluation will be completed by another provider, this initial triage assessment does not replace that evaluation, and the importance of remaining in the ED until their evaluation is complete.    Roxy Horseman, PA-C 06/28/22 [phone]

## 2022-06-28 NOTE — ED Notes (Signed)
Patient verbalizes understanding of discharge instructions. Opportunity for questioning and answers were provided. Armband removed by staff, pt discharged from ED. Ambulated out to lobby, cab called

## 2022-06-28 NOTE — ED Provider Notes (Signed)
[facility]-EMERGENCY DEPT Provider Note   CSN: [id number] Arrival date & time: 06/28/22  0335     History  Chief Complaint  Patient presents with   Hypertension    Stacy Nash is a 71 y.o. female.   Hypertension     71 year old female with medical history Significant for depression, DM 2, Graves' disease, HTN, HLD, PTSD, paroxysmal atrial fibrillation not on anticoagulation who presents to the emergency department with headache, blurry vision and hypertension.  The patient states that she had a gradual onset headache yesterday.  She denies any sudden onset or maximal onset of her headache.  She denies any fevers or chills, neck stiffness.  She states the headache came on and radiates from her occiput to her forehead.  She denies any falls or trauma.  She states that she has been having episodes of high blood pressure at home.  She had previously reported some neck pain but denies any pain in her neck to me.  She has been compliant with her home antihypertensives.  She denies any numbness, endorses some tingling in her hands and feet, denies any facial droop, focal weakness.  She states that earlier overnight, she had some chest pressure which has since resolved.  Home Medications Prior to Admission medications   Medication Sig Start Date End Date Taking? Authorizing Provider  ACIDOPHILUS LACTOBACILLUS PO Take 2 tablets by mouth daily.    [provider]  busPIRone (BUSPAR) 10 MG tablet Take 10 mg by mouth 2 (two) times daily.     [provider]  cyclobenzaprine (FLEXERIL) 10 MG tablet Take 5 mg by mouth at bedtime as needed for muscle spasms.    [provider]  gabapentin (NEURONTIN) 100 MG capsule Take 200 mg by mouth 3 (three) times daily.     [provider]  GUAIFENESIN PO Take 400 mg by mouth 2 (two) times daily as needed.    [provider]  hydrochlorothiazide (HYDRODIURIL) 50 MG tablet Take 50 mg by mouth  daily.    [provider]  latanoprost (XALATAN) 0.005 % ophthalmic solution Place 1 drop into the right eye at bedtime.    [provider]  levothyroxine (SYNTHROID) 75 MCG tablet Take 75 mcg by mouth daily before breakfast.    [provider]  lidocaine (LIDODERM) 5 % Place 1 patch onto the skin daily as needed (back pain). Remove & Discard patch within 12 hours or as directed by MD    [provider]  metFORMIN (GLUCOPHAGE) 500 MG tablet Take 500 mg by mouth See admin instructions. Take 500 mg by mouth in the morning before breakfast and an additional 500 mg at bedtime if needed for CBG >170    [provider]  metoprolol tartrate (LOPRESSOR) 50 MG tablet Take 50 mg by mouth in the morning, at noon, and at bedtime.    [provider]  omeprazole (PRILOSEC) 40 MG capsule Take 40 mg by mouth daily.    [provider]  ondansetron (ZOFRAN-ODT) 8 MG disintegrating tablet Take 1 tablet (8 mg total) by mouth every 8 (eight) hours as needed for nausea or vomiting. 06/13/22   Molpus, John, MD  potassium chloride 20 MEQ/15ML (10%) SOLN Take 40 mEq by mouth 2 (two) times daily.     [provider]  traZODone (DESYREL) 50 MG tablet Take 25 mg by mouth at bedtime as needed (sleep).     [provider]      Allergies  Doxepin, Trazodone, Dog epithelium allergy skin test, Nitrofurantoin, Cephalexin, Lisinopril, Montelukast, Pseudoephedrine, Risperidone and related, and Sudafed [pseudoephedrine hcl]    Review of Systems   Review of Systems  All other systems reviewed and are negative.   Physical Exam Updated Vital Signs BP (!) 152/73   Pulse (!) 54   Temp (!) 97.5 F (36.4 C) (Oral)   Resp 18   Ht 5\' 5"  (1.651 m) Comment: Simultaneous filing. User may not have seen previous data.  Wt 80.3 kg Comment: Simultaneous filing. User may not have seen previous data.  SpO2 98%   BMI 29.45 kg/m  Physical Exam Vitals and  nursing note reviewed.  Constitutional:      General: She is not in acute distress.    Appearance: She is well-developed.  HENT:     Head: Normocephalic and atraumatic.  Eyes:     Conjunctiva/sclera: Conjunctivae normal.  Cardiovascular:     Rate and Rhythm: Normal rate. Rhythm irregular.  Pulmonary:     Effort: Pulmonary effort is normal. No respiratory distress.     Breath sounds: Normal breath sounds.  Abdominal:     Palpations: Abdomen is soft.     Tenderness: There is no abdominal tenderness.  Musculoskeletal:        General: No swelling.     Cervical back: Neck supple.  Skin:    General: Skin is warm and dry.     Capillary Refill: Capillary refill takes less than 2 seconds.  Neurological:     Mental Status: She is alert.     Comments: No cranial nerve deficit, moving all 4 extremities with intact strength and sensation  Psychiatric:        Mood and Affect: Mood normal.     ED Results / Procedures / Treatments   Labs (all labs ordered are listed, but only abnormal results are displayed) Labs Reviewed  CBC WITH DIFFERENTIAL/PLATELET - Abnormal; Notable for the following components:      Result Value   RBC 5.25 (*)    RDW 16.0 (*)    All other components within normal limits  BASIC METABOLIC PANEL - Abnormal; Notable for the following components:   Glucose, Bld 169 (*)    All other components within normal limits  URINALYSIS, ROUTINE W REFLEX MICROSCOPIC - Abnormal; Notable for the following components:   Hgb urine dipstick SMALL (*)    Ketones, ur 5 (*)    Protein, ur 30 (*)    All other components within normal limits  CBG MONITORING, ED - Abnormal; Notable for the following components:   Glucose-Capillary 119 (*)    All other components within normal limits  TROPONIN I (HIGH SENSITIVITY) - Abnormal; Notable for the following components:   Troponin I (High Sensitivity) 20 (*)    All other components within normal limits  TROPONIN I (HIGH SENSITIVITY)     EKG EKG Interpretation  Date/Time:  Wednesday June 28 2022 14:14:29 EST Ventricular Rate:  77 PR Interval:    QRS Duration: 85 QT Interval:  417 QTC Calculation: 472 R Axis:   50 Text Interpretation: Atrial fibrillation Borderline T wave abnormalities Confirmed by [id number]) on 06/28/2022 6:26:34 PM  Radiology CT HEAD WO CONTRAST (06/30/2022)  Result Date: 06/28/2022 CLINICAL DATA:  Headache with hypertension. EXAM: CT HEAD WITHOUT CONTRAST TECHNIQUE: Contiguous axial images were obtained from the base of the skull through the vertex without intravenous contrast. RADIATION DOSE REDUCTION: This exam was performed according to the departmental dose-optimization program which  includes automated exposure control, adjustment of the mA and/or kV according to patient size and/or use of iterative reconstruction technique. COMPARISON:  CT head 09/23/2017 FINDINGS: Brain: There is slight cerebral cortical atrophy with mild small vessel disease in the cerebral white matter. The cerebellum and brainstem are unremarkable. No asymmetry is seen concerning for an acute infarct, hemorrhage or mass. The ventricles are normal in size and position. The basal cisterns are clear. Vascular: There are calcifications in the carotid siphons. There are no hyperdense central vessels. Skull: Negative for fractures or focal lesions. Sinuses/Orbits: Visualized sinuses and mastoid air cells are clear. Orbital contents are unremarkable. Other: None. IMPRESSION: No acute intracranial CT findings. Chronic changes.  Stable CT exam. Electronically Signed   By: Almira Bar M.D.   On: 06/28/2022 04:47    Procedures Procedures    Medications Ordered in ED Medications  hydrALAZINE (APRESOLINE) tablet 50 mg (50 mg Oral Given 06/28/22 0408)  metoprolol tartrate (LOPRESSOR) tablet 50 mg (50 mg Oral Given 06/28/22 1355)  hydrALAZINE (APRESOLINE) injection 10 mg (10 mg Intravenous Given 06/28/22 1355)  acetaminophen  (TYLENOL) tablet 1,000 mg (1,000 mg Oral Given 06/28/22 1355)  prochlorperazine (COMPAZINE) injection 10 mg (10 mg Intravenous Given 06/28/22 1354)  lactated ringers bolus 500 mL (0 mLs Intravenous Stopped 06/28/22 1550)    ED Course/ Medical Decision Making/ A&P Clinical Course as of 06/28/22 1831  Wed Jun 28, 2022  1517 HTN, pending delta trop, if better can go home [MK]    Clinical Course User Index [MK] Glendora Score, MD                           Medical Decision Making Risk OTC drugs. Prescription drug management.     71 year old female with medical history Significant for depression, DM 2, Graves' disease, HTN, HLD, PTSD, paroxysmal atrial fibrillation not on anticoagulation who presents to the emergency department with headache, blurry vision and hypertension.  The patient states that she had a gradual onset headache yesterday.  She denies any sudden onset or maximal onset of her headache.  She denies any fevers or chills, neck stiffness.  She states the headache came on and radiates from her occiput to her forehead.  She denies any falls or trauma.  She states that she has been having episodes of high blood pressure at home.  She had previously reported some neck pain but denies any pain in her neck to me.  She has been compliant with her home antihypertensives.  She denies any numbness, endorses some tingling in her hands and feet, denies any facial droop, focal weakness.  She states that earlier overnight, she had some chest pressure which has since resolved.  On arrival, the patient was afebrile, not tachycardic or tachypneic, hypertensive BP 188/85, saturating 99% on room air.  Patient presenting with headache blurry vision in the setting of uncontrolled hypertension.  Considered hypertensive emergency versus urgency.  The patient's headache was not sudden in onset or maximal in onset nor was the worst headache of her life.  Low concern for subarachnoid hemorrhage.  No falls or  trauma.  No neurologic deficits to suggest stroke like symptoms.  Initial EKG revealed rate controlled atrial fibrillation.  Laboratory work-up significant for BMP without evidence of renal dysfunction, mild hyperglycemia present, urinalysis with small hemoglobin, negative for UTI, CBC without a leukocytosis or anemia.  Initial troponin normal, repeat troponin pending.  The patient was treated with a migraine cocktail with  a LR bolus, Tylenol, Compazine.  She was administered oral hydralazine initially without improvement and was subsequently administered oral metoprolol 50 mg and 10 mg of IV hydralazine due to the patient's symptoms of blurry vision in the setting of hypertension.  CT head was performed and results are as follows: IMPRESSION:  No acute intracranial CT findings. Chronic changes.  Stable CT exam.    Low concern for ACS, the patient has no chest pain at this time.  No clear evidence on initial assessment and labs of hypertensive emergency.  Plan will be to reassess the patient's symptoms following the above interventions, delta troponin pending, plan for repeat assessment with dispo pending assessment.  Signout given to Dr. Earle Gell at 1500.   Final Clinical Impression(s) / ED Diagnoses Final diagnoses:  Uncontrolled hypertension  Bad headache    Rx / DC Orders ED Discharge Orders     None         Ernie Avena, MD 06/28/22 1831

## 2022-11-04 ENCOUNTER — Encounter (HOSPITAL_COMMUNITY): Payer: Self-pay | Admitting: Emergency Medicine

## 2022-11-04 ENCOUNTER — Ambulatory Visit (HOSPITAL_COMMUNITY)
Admission: EM | Admit: 2022-11-04 | Discharge: 2022-11-04 | Disposition: A | Discharge status: Home / Self Care | Payer: No Typology Code available for payment source

## 2022-11-04 ENCOUNTER — Other Ambulatory Visit: Payer: Self-pay

## 2022-11-04 DIAGNOSIS — R197 Diarrhea, unspecified: Secondary | ICD-10-CM

## 2022-11-04 DIAGNOSIS — K529 Noninfective gastroenteritis and colitis, unspecified: Secondary | ICD-10-CM

## 2022-11-04 NOTE — Discharge Instructions (Signed)
Advised to continue with clear liquids and bland diet over the next couple days. Advised take Zofran 4 mg every 8 hours as needed for stomach discomfort and this will also help to slow the diarrhea.  Advised follow-up PCP return to urgent care as needed

## 2022-11-04 NOTE — ED Triage Notes (Signed)
Wednesday started having diarrhea.  Did take pepto bismal.  Reports diarrhea slowed .  Patient's first meal post illness was chicken, gravy, green beans and rice.  During the night , diarrhea started again.  Blood pressure during the night 195/104, 104.  Patient took clonidine.  Patient also took pepto bismal.  Patient claims diarrhea consisted of pink color.  Last bp 145/82 , 68prior to arrival here today.    [facility] told patient to come to urgent care.   Only had one diarrhea stool since 1:00 episode

## 2022-11-04 NOTE — ED Provider Notes (Signed)
[facility]    CSN: [id number] Arrival date & time: 11/04/22  1005      History   Chief Complaint Chief Complaint  Patient presents with   Diarrhea    HPI MCCLAIN CZEPIEL is a 72 y.o. female.   72 year old female presents with diarrhea and stomach discomfort.  Patient indicates that she ate at an acquaintance's house on Wednesday evening, meal was chicken along with vegetables.  She indicates that the next morning she started having stomach cramping, bloating, and started having mild diarrhea.  Patient indicates that she took some Pepto which helped in prove her symptoms.  She indicates then on Friday she had some more diarrhea and loose stools.  She indicates she took a little bit more Pepto and it tended to improve her symptoms.  Also she relates having elevated blood pressure in which she was advised by the [facility] that if he gets elevated she is to take the clonidine which she did.  She indicates that this helped to improve her blood pressure and also decrease her pulse.  She indicates today she has improved however she still continues to have some stomach cramping and discomfort.  She relates her last episode of diarrhea was this morning early, no blood but she did indicate there was some pinkness to the diarrhea.  She is without fever, no nausea or vomiting.  She has been on a bland diet and has been drinking clear liquids.   Diarrhea   Past Medical History:  Diagnosis Date   Chronic venous insufficiency    peripheral   Depression    Diabetes mellitus without complication (HCC)    Type 2   Graves disease    Helicobacter pylori infection    Hyperlipidemia    Hypertension    Hypothyroidism    Memory deficit following unspecified cerebrovascular disease    Metabolic syndrome    Paroxysmal atrial fibrillation (HCC)    PTSD (post-traumatic stress disorder)     Patient Active Problem List   Diagnosis Date Noted   Chest pain 12/03/2019   Benign essential HTN  12/03/2019   Hypothyroidism 12/03/2019    Past Surgical History:  Procedure Laterality Date   COLONOSCOPY     ESOPHAGOGASTRODUODENOSCOPY     THYROIDECTOMY     VAGINAL HYSTERECTOMY      OB History   No obstetric history on file.      Home Medications    Prior to Admission medications   Medication Sig Start Date End Date Taking? Authorizing Provider  hydrALAZINE (APRESOLINE) 50 MG tablet Take 1 tablet by mouth 2 (two) times daily. 07/30/22 07/31/23 Yes [provider]  ACIDOPHILUS LACTOBACILLUS PO Take 2 tablets by mouth daily.    [provider]  busPIRone (BUSPAR) 10 MG tablet Take 10 mg by mouth 2 (two) times daily.     [provider]  cyclobenzaprine (FLEXERIL) 10 MG tablet Take 5 mg by mouth at bedtime as needed for muscle spasms.    [provider]  gabapentin (NEURONTIN) 100 MG capsule Take 200 mg by mouth 3 (three) times daily.     [provider]  GUAIFENESIN PO Take 400 mg by mouth 2 (two) times daily as needed.    [provider]  hydrochlorothiazide (HYDRODIURIL) 50 MG tablet Take 50 mg by mouth daily. Patient not taking: Reported on 11/04/2022    [provider]  latanoprost (XALATAN) 0.005 % ophthalmic solution Place 1 drop into the right eye at bedtime.  [provider]  levothyroxine (SYNTHROID) 75 MCG tablet Take 75 mcg by mouth daily before breakfast.    [provider]  lidocaine (LIDODERM) 5 % Place 1 patch onto the skin daily as needed (back pain). Remove & Discard patch within 12 hours or as directed by MD    [provider]  metFORMIN (GLUCOPHAGE) 500 MG tablet Take 500 mg by mouth See admin instructions. Take 500 mg by mouth in the morning before breakfast and an additional 500 mg at bedtime if needed for CBG >170    [provider]  metoprolol tartrate (LOPRESSOR) 50 MG tablet Take 50 mg by mouth in the morning, at noon, and at bedtime.    [provider]  omeprazole (PRILOSEC) 40 MG capsule Take 40 mg by mouth daily.    [provider]  ondansetron (ZOFRAN-ODT) 8 MG disintegrating tablet Take 1 tablet (8 mg total) by mouth every 8 (eight) hours as needed for nausea or vomiting. 06/13/22   Molpus, John, MD  potassium chloride 20 MEQ/15ML (10%) SOLN Take 40 mEq by mouth 2 (two) times daily.     [provider]  traZODone (DESYREL) 50 MG tablet Take 25 mg by mouth at bedtime as needed (sleep).  Patient not taking: Reported on 11/04/2022    [provider]    Family History Family History  Problem Relation Age of Onset   Diabetes Father    Cervical cancer Sister    Colon cancer Neg Hx    Esophageal cancer Neg Hx    Pancreatic cancer Neg Hx    Stomach cancer Neg Hx     Social History Social History   Tobacco Use   Smoking status: Never   Smokeless tobacco: Never  Vaping Use   Vaping Use: Never used  Substance Use Topics   Alcohol use: No   Drug use: Never     Allergies   Doxepin, Trazodone, Dog epithelium (canis lupus familiaris), Nitrofurantoin, Cephalexin, Lisinopril, Montelukast, Pseudoephedrine, Risperidone and related, and Sudafed [pseudoephedrine hcl]   Review of Systems Review of Systems  Gastrointestinal:  Positive for diarrhea.     Physical Exam Triage Vital Signs ED Triage Vitals  Enc Vitals Group     BP 11/04/22 1040 (!) 159/76     Pulse Rate 11/04/22 1040 60     Resp 11/04/22 1040 20     Temp 11/04/22 1040 98.1 F (36.7 C)     Temp Source 11/04/22 1040 Oral     SpO2 11/04/22 1040 96 %     Weight --      Height --      Head Circumference --      Peak Flow --      Pain Score 11/04/22 1036 0     Pain Loc --      Pain Edu? --      Excl. in [place]? --    No data found.  Updated Vital Signs BP (!) 159/76 (BP Location: Right Arm)   Pulse 60   Temp 98.1 F (36.7 C) (Oral)   Resp 20   SpO2 96%   Visual Acuity Right Eye Distance:   Left Eye Distance:   Bilateral  Distance:    Right Eye Near:   Left Eye Near:    Bilateral Near:     Physical Exam Constitutional:      Appearance: Normal appearance.  Cardiovascular:     Rate and Rhythm: Normal rate and regular rhythm.  Heart sounds: Normal heart sounds.  Pulmonary:     Effort: Pulmonary effort is normal.     Breath sounds: Normal breath sounds and air entry. No wheezing, rhonchi or rales.  Abdominal:     General: Abdomen is flat. Bowel sounds are normal.     Palpations: Abdomen is soft.     Tenderness: There is generalized abdominal tenderness. There is no guarding or rebound.  Neurological:     Mental Status: She is alert.      UC Treatments / Results  Labs (all labs ordered are listed, but only abnormal results are displayed) Labs Reviewed - No data to display  EKG   Radiology No results found.  Procedures Procedures (including critical care time)  Medications Ordered in UC Medications - No data to display  Initial Impression / Assessment and Plan / UC Course  I have reviewed the triage vital signs and the nursing notes.  Pertinent labs & imaging results that were available during my care of the patient were reviewed by me and considered in my medical decision making (see chart for details).    Plan: The diagnosis will be treated with the following: 1.  Gastroenteritis: A.  Advised take Zofran 4 mg every 8 hours to help decrease stomach discomfort and to slow the diarrhea. B.  Advised to continue with a clear liquid diet along with brat over the next 48 hours. 2.  Diarrhea: A.  Advised to take Zofran 4 mg every 8 hours to help control stomach cramping and to slow the diarrhea. B.  Advised to increase clear liquid intake over the next 48 hours. 3.  Advised follow-up PCP return to urgent care as needed. Final Clinical Impressions(s) / UC Diagnoses   Final diagnoses:  Diarrhea, unspecified type  Gastroenteritis     Discharge Instructions      Advised to  continue with clear liquids and bland diet over the next couple days. Advised take Zofran 4 mg every 8 hours as needed for stomach discomfort and this will also help to slow the diarrhea.  Advised follow-up PCP return to urgent care as needed    ED Prescriptions   None    PDMP not reviewed this encounter.   Nyoka Lint, PA-C 11/04/22 1114
